# Patient Record
Sex: Female | Born: 1950 | Race: White | Hispanic: No | Smoking: Never smoker
Health system: Southern US, Community
[De-identification: ages and names within clinical notes are randomized; demographics above are authoritative.]

## PROBLEM LIST (undated history)

## (undated) DIAGNOSIS — F419 Anxiety disorder, unspecified: Secondary | ICD-10-CM

## (undated) DIAGNOSIS — T8859XA Other complications of anesthesia, initial encounter: Secondary | ICD-10-CM

## (undated) DIAGNOSIS — D649 Anemia, unspecified: Secondary | ICD-10-CM

## (undated) DIAGNOSIS — K219 Gastro-esophageal reflux disease without esophagitis: Secondary | ICD-10-CM

## (undated) DIAGNOSIS — Z9889 Other specified postprocedural states: Secondary | ICD-10-CM

## (undated) DIAGNOSIS — J45909 Unspecified asthma, uncomplicated: Secondary | ICD-10-CM

## (undated) DIAGNOSIS — M1990 Unspecified osteoarthritis, unspecified site: Secondary | ICD-10-CM

## (undated) DIAGNOSIS — Z87442 Personal history of urinary calculi: Secondary | ICD-10-CM

## (undated) DIAGNOSIS — R011 Cardiac murmur, unspecified: Secondary | ICD-10-CM

## (undated) HISTORY — PX: KIDNEY STONE SURGERY: SHX686

## (undated) HISTORY — PX: OTHER SURGICAL HISTORY: SHX169

## (undated) HISTORY — PX: PARTIAL HYSTERECTOMY: SHX80

## (undated) HISTORY — PX: BREAST CYST EXCISION: SHX579

## (undated) HISTORY — PX: MULTIPLE TOOTH EXTRACTIONS: SHX2053

---

## 1984-12-15 HISTORY — PX: BREAST CYST EXCISION: SHX579

## 1998-06-06 ENCOUNTER — Other Ambulatory Visit: Admission: RE | Admit: 1998-06-06 | Discharge: 1998-06-06 | Payer: Self-pay | Admitting: Gynecology

## 1999-07-19 ENCOUNTER — Ambulatory Visit (HOSPITAL_BASED_OUTPATIENT_CLINIC_OR_DEPARTMENT_OTHER): Admission: RE | Admit: 1999-07-19 | Discharge: 1999-07-19 | Payer: Self-pay | Admitting: *Deleted

## 2000-07-23 ENCOUNTER — Other Ambulatory Visit: Admission: RE | Admit: 2000-07-23 | Discharge: 2000-07-23 | Payer: Self-pay | Admitting: Gynecology

## 2000-08-12 ENCOUNTER — Other Ambulatory Visit: Admission: RE | Admit: 2000-08-12 | Discharge: 2000-08-12 | Payer: Self-pay | Admitting: Gynecology

## 2000-08-12 ENCOUNTER — Encounter (INDEPENDENT_AMBULATORY_CARE_PROVIDER_SITE_OTHER): Payer: Self-pay

## 2001-05-26 ENCOUNTER — Encounter (INDEPENDENT_AMBULATORY_CARE_PROVIDER_SITE_OTHER): Payer: Self-pay | Admitting: Specialist

## 2001-05-26 ENCOUNTER — Inpatient Hospital Stay (HOSPITAL_COMMUNITY): Admission: RE | Admit: 2001-05-26 | Discharge: 2001-05-27 | Payer: Self-pay | Admitting: Gynecology

## 2002-05-19 ENCOUNTER — Other Ambulatory Visit: Admission: RE | Admit: 2002-05-19 | Discharge: 2002-05-19 | Payer: Self-pay | Admitting: Gynecology

## 2002-09-14 ENCOUNTER — Ambulatory Visit (HOSPITAL_COMMUNITY): Admission: RE | Admit: 2002-09-14 | Discharge: 2002-09-14 | Payer: Self-pay | Admitting: Gastroenterology

## 2003-07-17 ENCOUNTER — Other Ambulatory Visit: Admission: RE | Admit: 2003-07-17 | Discharge: 2003-07-17 | Payer: Self-pay | Admitting: Gynecology

## 2004-07-17 ENCOUNTER — Other Ambulatory Visit: Admission: RE | Admit: 2004-07-17 | Discharge: 2004-07-17 | Payer: Self-pay | Admitting: Gynecology

## 2005-07-09 ENCOUNTER — Ambulatory Visit: Payer: Self-pay | Admitting: Oncology

## 2005-07-16 ENCOUNTER — Other Ambulatory Visit: Admission: RE | Admit: 2005-07-16 | Discharge: 2005-07-16 | Payer: Self-pay | Admitting: Gynecology

## 2006-07-22 ENCOUNTER — Other Ambulatory Visit: Admission: RE | Admit: 2006-07-22 | Discharge: 2006-07-22 | Payer: Self-pay | Admitting: Gynecology

## 2007-08-03 ENCOUNTER — Other Ambulatory Visit: Admission: RE | Admit: 2007-08-03 | Discharge: 2007-08-03 | Payer: Self-pay | Admitting: Gynecology

## 2008-08-24 ENCOUNTER — Other Ambulatory Visit: Admission: RE | Admit: 2008-08-24 | Discharge: 2008-08-24 | Payer: Self-pay | Admitting: Gynecology

## 2011-05-02 NOTE — H&P (Signed)
[facility]  Patient:    Shelly Parker, Shelly Parker                      MRN: [id number] Adm. Date:  05/26/01 Attending:  Leatha Gilding. Mezer, M.D.                         History and Physical  ADMITTING DIAGNOSIS:  Fibroid uterus and menorrhagia.  HISTORY OF PRESENT ILLNESS:  The patient is a 60 year old nulligravida female admitted with severe menorrhagia and fibroid uterus for subtotal hysterectomy and possible bilateral salpingo-oophorectomy.  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  The menorrhagia has become progressively worse and endometrial biopsy was obtained in August of 2001 which revealed benign proliferative endometrium and no hyperplasia or malignancy identified.  The bleeding has continued, which has now progressed to flooding, which on two episodes has frightened the patient tremendously because of the volume and duration of the bleeding.  Saline ultrasound was performed which revealed a fibroid uterus with a normal cavity.  The patient was encouraged to either consider a hysterectomy or a uterine fibroid embolization to prevent future flooding. The patient has been extremely anxious regarding this situation.  The patient has chosen to proceed with a subtotal hysterectomy.  The patient has been advised to have both of her ovaries removed secondary to her age and with the possibility of breast cancer in association with ovarian cancer and that oophorectomy may reduce this chance; the patient has declined to have an elective salpingo-oophorectomy but would want her ovaries removed if they appeared to be abnormal at the time of the surgery or it is surgically necessary to remove the ovaries.  The patient also wants to remove as little tissue as possible and has elected to proceed with a subtotal hysterectomy. The patient understands that there may be some bleeding after this  procedure. Patient states that all of her Pap smears have been normal.  The potential complications including anesthesia, injury to the bowel, bladder, ureters and major blood vessels, and blood loss with transfusion and its sequelae and infection have been discussed with the patient and her husband in detail. Postoperative restrictions and expectations have been reviewed in great detail.  PAST MEDICAL HISTORY:  Surgical:  Breast biopsy x 2, teeth.  Medical:  Noncontributory.  MEDICATIONS:  St. Johns wort, tamoxifen, iron, vitamins.  ALLERGIES:  None known.  FAMILY HISTORY:  Noncontributory.  SOCIAL HISTORY:  The patient is married with a supportive husband.  PHYSICAL EXAMINATION:  HEENT:  Normal.  LUNGS:  Clear.  HEART:  Without murmurs.  BREASTS:  Soft without mass or discharge.  ABDOMEN:  Soft and nontender.  PELVIC:  Exam reveals the BUS, vagina and cervix to be normal.  The uterus is top normal in size and retroverted.  The adnexa are without masses.  EXTREMITIES:  Negative.  IMPRESSION: 1. Menorrhagia. 2. Fibroid uterus. 3. Atypical ductal hyperplasia of the breasts.  PLAN:  Subtotal hysterectomy and possible bilateral salpingo-oophorectomy. DD:  05/26/01 TD:  05/26/01 Job: [id number]

## 2011-05-02 NOTE — Op Note (Signed)
NAME:  Shelly Parker, Shelly Parker                       ACCOUNT NO.:  [id number]   MEDICAL RECORD NO.:  [id number]                   PATIENT TYPE:  AMB   LOCATION:  ENDO                                 FACILITY:  [facility]   PHYSICIAN:  Charna Elizabeth, M.D.                   DATE OF BIRTH:  06/16/1951   DATE OF PROCEDURE:  09/14/2002  DATE OF DISCHARGE:                                 OPERATIVE REPORT   PROCEDURE PERFORMED:  Colonoscopy.   ENDOSCOPIST:  Charna Elizabeth, M.D.   INSTRUMENT USED:  Olympus video colonoscope.   INDICATIONS OF PROCEDURE:  A 60 year old white female undergoing screening  colonoscopy.  The patient has a history of rectal bleeding, family history  of colonic polyps in her sister, and a history of atypical hyperplasia of  the breast, rule out colonic polyps, masses hemorrhoids, etc.   PREPROCEDURE PREPARATION:  Informed consent was procured from the patient.  The patient fasted for eight hours prior to the procedure and prepped with a  bottle of magnesium citrate and a gallon of NuLytely the night prior to the  procedure.   PREPROCEDURE PHYSICAL:  The patient had stable vital signs.  Neck supple.  Chest clear to auscultation.  S1 and S2 regular.  Abdomen soft with normal  bowel sounds.   DESCRIPTION OF PROCEDURE:  The patient was placed in the left lateral  decubitus position and sedated with 100 mg of Demerol and 10 mg of Versed  intravenously.  Once the patient was adequately sedated and maintained on  low-flow oxygen and continuous cardiac monitoring, the Olympus video  colonoscope was advanced from the rectum to the cecum without difficulty.  The patient had some abdominal discomfort with insufflation of air into the  colon.  No masses, polyps, erosions, ulcerations or diverticula were seen.  The patient had small, nonbleeding internal hemorrhoids seen on retroflex in  the rectum.  The procedure was completed up to the cecum.  The appendiceal  orifice and ileocecal  valve were clearly visualized and photographed.   IMPRESSION:  1. Healthy-appearing colon up to the cecum.  2. Small, nonbleeding internal hemorrhoids.  3. Abdominal discomfort with insufflation of air into the colon.  Question     of visceral hypersensitivity.   RECOMMENDATIONS:  1. Increase fluid and fiber in the diet.  2.     Repeat colorectal cancer screening in the next five years unless the patient     develops any abnormal symptoms in the interim.  3. Outpatient followup on a p.r.n. basis.                                               Charna Elizabeth, M.D.    JM/MEDQ  D:  09/14/2002  T:  09/15/2002  Job:  [id number]  cc:   Lennis P. Darrold Span, M.D.  [address] [facility]  [place]  [place] [address]  Fax: [phone]   Anna Genre. Little, M.D.  [address]  [place]  [place] [address]  Fax: [phone]

## 2011-05-02 NOTE — Op Note (Signed)
[facility]  Patient:    Shelly Parker, Shelly Parker                    MRN: [id number] Proc. Date: 05/26/01 Attending:  Leatha Gilding. Mezer, M.D. CC:         Harl Bowie, M.D.                           Operative Report  PREOPERATIVE DIAGNOSES:  Menorrhagia, fibroid uterus.  POSTOPERATIVE DIAGNOSES:  Menorrhagia, fibroid uterus.  OPERATION PERFORMED:  Supracervical hysterectomy.  SURGEON:  Leatha Gilding. Mezer, M.D.  ASSISTANT:  Harl Bowie, M.D.  ANESTHESIA:  General endotracheal.  PREPARATION:  Betadine.  DESCRIPTION OF PROCEDURE:  With the patient in the supine position, she was prepped and draped in the routine fashion.  A Pfannenstiel incision was made through the skin and subcutaneous tissue.  The fascia and peritoneum were opened without difficulty.  Brief exploration of her upper abdomen was benign. Exploration of the pelvis revealed the uterus to be approximately 12+ weeks in size with multiple fibroids.  The ovaries were normal bilaterally.  throughout the procedure, the tissue appeared to have much less elasticity and was tougher than usual, making the dissection planes more difficult to find and develop.  The surgical team raised the question that the Tamoxifen may have been responsible for this change in tissue quality.  The round ligaments were suture ligated with #1 chromic and divided.  The uteroovarian ligaments were isolated, clamped, cut, and free tied with #1 chromic and suture ligated with #1 chromic.  The anterior leaf of the broad ligament was then opened.  The bladder was taken down sharply, as it was quite adherent to the body of the cervix.  There appeared to be no harm to the bladder.  The uterine arteries were clamped, cut, and suture ligated with #1 chromic.  One bite of the cardinal ligaments was taken, clamped, cut, and suture ligated with #1 chromic.  It was felt that sufficient tissue had been taken, and the uterus was  removed by separating the uterus from the cervix with cautery.  This was done with total visualization at all times.  Angled sutures of #1 chromic were then placed and then anterior and posterior figure-of-eight stitches were taken to close over the top of the cervix after the canal of the cervix had been cauterized with Bovie cautery.  Several small bleeding spots were arrested with cautery.  The pelvis was irrigated with warm lactated Ringers solution.  The ureters inspected and found to be out of harms way.  At the completion of the procedure, an effort was made to place the large bowel in the cul-de-sac.  The ovaries were elevated.  There was a large amount of stool throughout the colon.  The omentum was brought down.  The abdomen was closed in layers using a running 2-0 Vicryl on the peritoneum, running 0 Vicryl at the midline bilaterally on the fascia.  Hemostasis was assured in the subcutaneous tissue, and the skin was closed with staples.  The estimated blood loss was approximately 100 cc.  All sponge, needle and instrument counts were correct x 2.  The patient tolerated the procedure well and was taken to the recovery room in satisfactory condition. DD:  05/26/01 TD:  05/26/01 Job: [id number]

## 2016-06-12 ENCOUNTER — Other Ambulatory Visit: Payer: Self-pay | Admitting: Obstetrics & Gynecology

## 2016-06-12 DIAGNOSIS — R928 Other abnormal and inconclusive findings on diagnostic imaging of breast: Secondary | ICD-10-CM

## 2016-06-30 ENCOUNTER — Ambulatory Visit
Admission: RE | Admit: 2016-06-30 | Discharge: 2016-06-30 | Disposition: A | Discharge status: Home / Self Care | Payer: BLUE CROSS/BLUE SHIELD | Source: Ambulatory Visit | Attending: Obstetrics & Gynecology | Admitting: Obstetrics & Gynecology

## 2016-06-30 DIAGNOSIS — R928 Other abnormal and inconclusive findings on diagnostic imaging of breast: Secondary | ICD-10-CM

## 2016-12-24 ENCOUNTER — Other Ambulatory Visit: Payer: Self-pay | Admitting: Obstetrics & Gynecology

## 2016-12-24 DIAGNOSIS — N63 Unspecified lump in unspecified breast: Secondary | ICD-10-CM

## 2017-02-02 ENCOUNTER — Ambulatory Visit
Admission: RE | Admit: 2017-02-02 | Discharge: 2017-02-02 | Disposition: A | Discharge status: Home / Self Care | Payer: No Typology Code available for payment source | Source: Ambulatory Visit | Attending: Obstetrics & Gynecology | Admitting: Obstetrics & Gynecology

## 2017-02-02 DIAGNOSIS — N63 Unspecified lump in unspecified breast: Secondary | ICD-10-CM

## 2017-07-13 ENCOUNTER — Other Ambulatory Visit: Payer: Self-pay | Admitting: Obstetrics & Gynecology

## 2017-07-13 DIAGNOSIS — Z1231 Encounter for screening mammogram for malignant neoplasm of breast: Secondary | ICD-10-CM

## 2017-07-13 DIAGNOSIS — N63 Unspecified lump in unspecified breast: Secondary | ICD-10-CM

## 2017-08-04 ENCOUNTER — Ambulatory Visit
Admission: RE | Admit: 2017-08-04 | Discharge: 2017-08-04 | Disposition: A | Discharge status: Home / Self Care | Payer: No Typology Code available for payment source | Source: Ambulatory Visit | Attending: Obstetrics & Gynecology | Admitting: Obstetrics & Gynecology

## 2017-08-04 DIAGNOSIS — N63 Unspecified lump in unspecified breast: Secondary | ICD-10-CM

## 2018-06-21 ENCOUNTER — Other Ambulatory Visit: Payer: Self-pay | Admitting: Obstetrics & Gynecology

## 2018-06-21 DIAGNOSIS — N6320 Unspecified lump in the left breast, unspecified quadrant: Secondary | ICD-10-CM

## 2018-08-05 ENCOUNTER — Ambulatory Visit
Admission: RE | Admit: 2018-08-05 | Discharge: 2018-08-05 | Disposition: A | Discharge status: Home / Self Care | Payer: Medicare Other | Source: Ambulatory Visit | Attending: Obstetrics & Gynecology | Admitting: Obstetrics & Gynecology

## 2018-08-05 DIAGNOSIS — N6320 Unspecified lump in the left breast, unspecified quadrant: Secondary | ICD-10-CM

## 2019-09-21 ENCOUNTER — Other Ambulatory Visit: Payer: Self-pay | Admitting: Obstetrics & Gynecology

## 2019-09-21 DIAGNOSIS — R928 Other abnormal and inconclusive findings on diagnostic imaging of breast: Secondary | ICD-10-CM

## 2019-09-28 ENCOUNTER — Ambulatory Visit: Payer: Medicare Other

## 2019-09-28 ENCOUNTER — Ambulatory Visit
Admission: RE | Admit: 2019-09-28 | Discharge: 2019-09-28 | Disposition: A | Discharge status: Home / Self Care | Payer: Medicare Other | Source: Ambulatory Visit | Attending: Obstetrics & Gynecology | Admitting: Obstetrics & Gynecology

## 2019-09-28 ENCOUNTER — Other Ambulatory Visit: Payer: Self-pay

## 2019-09-28 DIAGNOSIS — R928 Other abnormal and inconclusive findings on diagnostic imaging of breast: Secondary | ICD-10-CM

## 2022-02-13 ENCOUNTER — Other Ambulatory Visit: Payer: Self-pay | Admitting: Obstetrics & Gynecology

## 2022-02-13 DIAGNOSIS — R928 Other abnormal and inconclusive findings on diagnostic imaging of breast: Secondary | ICD-10-CM

## 2022-03-10 ENCOUNTER — Ambulatory Visit: Payer: Medicare Other

## 2022-03-10 ENCOUNTER — Ambulatory Visit
Admission: RE | Admit: 2022-03-10 | Discharge: 2022-03-10 | Disposition: A | Discharge status: Home / Self Care | Payer: Medicare Other | Source: Ambulatory Visit | Attending: Obstetrics & Gynecology | Admitting: Obstetrics & Gynecology

## 2022-03-10 ENCOUNTER — Other Ambulatory Visit: Payer: Self-pay

## 2022-03-10 DIAGNOSIS — R928 Other abnormal and inconclusive findings on diagnostic imaging of breast: Secondary | ICD-10-CM

## 2022-12-16 NOTE — Progress Notes (Signed)
Anesthesia Review:  PCP: [name] LOV 11/28/22  Cardiologist : Chest x-ray : EKG : Echo : Stress test: Cardiac Cath :  Activity level:  Sleep Study/ CPAP : Fasting Blood Sugar :      / Checks Blood Sugar -- times a day:   Blood Thinner/ Instructions /Last Dose: ASA / Instructions/ Last Dose :

## 2022-12-17 NOTE — Patient Instructions (Signed)
SURGICAL WAITING ROOM VISITATION  Patients having surgery or a procedure may have no more than 2 support people in the waiting area - these visitors may rotate.    Children under the age of 76 must have an adult with them who is not the patient.  Due to an increase in RSV and influenza rates and associated hospitalizations, children ages 40 and under may not visit patients in [place].  If the patient needs to stay at the hospital during part of their recovery, the visitor guidelines for inpatient rooms apply. Pre-op nurse will coordinate an appropriate time for 1 support person to accompany patient in pre-op.  This support person may not rotate.    Please refer to the [facility] website for the visitor guidelines for Inpatients (after your surgery is over and you are in a regular room).       Your procedure is scheduled on:  12/23/2022    Report to [facility] Main Entrance    Report to admitting at   1200 noon    Call this number if you have problems the morning of surgery [phone]   Do not eat food :After Midnight.   After Midnight you may have the following liquids until _ 1115_____ AM DAY OF SURGERY  Water Non-Citrus Juices (without pulp, NO RED-Apple, White grape, White cranberry) Black Coffee (NO MILK/CREAM OR CREAMERS, sugar ok)  Clear Tea (NO MILK/CREAM OR CREAMERS, sugar ok) regular and decaf                             Plain Jell-O (NO RED)                                           Fruit ices (not with fruit pulp, NO RED)                                     Popsicles (NO RED)                                                               Sports drinks like Gatorade (NO RED)              .        The day of surgery:  Drink ONE (1) Pre-Surgery Clear Ensure or G2 at 1115  AM ( have completed by )  the morning of surgery. Drink in one sitting. Do not sip.  This drink was given to you during your hospital  pre-op appointment visit. Nothing else  to drink after completing the  Pre-Surgery Clear Ensure or G2.          If you have questions, please contact your surgeon's office.       Oral Hygiene is also important to reduce your risk of infection.                                    Remember - BRUSH YOUR TEETH THE  MORNING OF SURGERY WITH YOUR REGULAR TOOTHPASTE  DENTURES WILL BE REMOVED PRIOR TO SURGERY PLEASE DO NOT APPLY "Poly grip" OR ADHESIVES!!!   Do NOT smoke after Midnight   Take these medicines the morning of surgery with A SIP OF WATER:  Inhalers as usual and bring, floniase, toprol   DO NOT TAKE ANY ORAL DIABETIC MEDICATIONS DAY OF YOUR SURGERY  Bring CPAP mask and tubing day of surgery.                              You may not have any metal on your body including hair pins, jewelry, and body piercing             Do not wear make-up, lotions, powders, perfumes/cologne, or deodorant  Do not wear nail polish including gel and S&S, artificial/acrylic nails, or any other type of covering on natural nails including finger and toenails. If you have artificial nails, gel coating, etc. that needs to be removed by a nail salon please have this removed prior to surgery or surgery may need to be canceled/ delayed if the surgeon/ anesthesia feels like they are unable to be safely monitored.   Do not shave  48 hours prior to surgery.               Men may shave face and neck.   Do not bring valuables to the hospital. [place].   Contacts, glasses, dentures or bridgework may not be worn into surgery.   Bring small overnight bag day of surgery.   DO NOT [place]. PHARMACY WILL DISPENSE MEDICATIONS LISTED ON YOUR MEDICATION LIST TO YOU DURING YOUR ADMISSION [place]!    Patients discharged on the day of surgery will not be allowed to drive home.  Someone NEEDS to stay with you for the first 24 hours after anesthesia.   Special  Instructions: Bring a copy of your healthcare power of attorney and living will documents the day of surgery if you haven't scanned them before.              Please read over the following fact sheets you were given: IF [place] [phone]   If you received a COVID test during your pre-op visit  it is requested that you wear a mask when out in public, stay away from anyone that may not be feeling well and notify your surgeon if you develop symptoms. If you test positive for Covid or have been in contact with anyone that has tested positive in the last 10 days please notify you surgeon.    [place] - Preparing for Surgery Before surgery, you can play an important role.  Because skin is not sterile, your skin needs to be as free of germs as possible.  You can reduce the number of germs on your skin by washing with CHG (chlorahexidine gluconate) soap before surgery.  CHG is an antiseptic cleaner which kills germs and bonds with the skin to continue killing germs even after washing. Please DO NOT use if you have an allergy to CHG or antibacterial soaps.  If your skin becomes reddened/irritated stop using the CHG and inform your nurse when you arrive at Short Stay. Do not shave (including legs and underarms) for  at least 48 hours prior to the first CHG shower.  You may shave your face/neck. Please follow these instructions carefully:  1.  Shower with CHG Soap the night before surgery and the  morning of Surgery.  2.  If you choose to wash your hair, wash your hair first as usual with your  normal  shampoo.  3.  After you shampoo, rinse your hair and body thoroughly to remove the  shampoo.                           4.  Use CHG as you would any other liquid soap.  You can apply chg directly  to the skin and wash                       Gently with a scrungie or clean washcloth.  5.  Apply the CHG Soap to your body ONLY FROM THE NECK DOWN.   Do not use  on face/ open                           Wound or open sores. Avoid contact with eyes, ears mouth and genitals (private parts).                       Wash face,  Genitals (private parts) with your normal soap.             6.  Wash thoroughly, paying special attention to the area where your surgery  will be performed.  7.  Thoroughly rinse your body with warm water from the neck down.  8.  DO NOT shower/wash with your normal soap after using and rinsing off  the CHG Soap.                9.  Pat yourself dry with a clean towel.            10.  Wear clean pajamas.            11.  Place clean sheets on your bed the night of your first shower and do not  sleep with pets. Day of Surgery : Do not apply any lotions/deodorants the morning of surgery.  Please wear clean clothes to the hospital/surgery center.  FAILURE TO FOLLOW THESE INSTRUCTIONS MAY RESULT IN THE CANCELLATION OF YOUR SURGERY PATIENT SIGNATURE_________________________________  NURSE SIGNATURE__________________________________  ________________________________________________________________________

## 2022-12-18 ENCOUNTER — Other Ambulatory Visit: Payer: Self-pay

## 2022-12-18 ENCOUNTER — Encounter (HOSPITAL_COMMUNITY)
Admission: RE | Admit: 2022-12-18 | Discharge: 2022-12-18 | Disposition: A | Discharge status: Home / Self Care | Payer: Medicare Other | Source: Ambulatory Visit | Attending: Orthopedic Surgery | Admitting: Orthopedic Surgery

## 2022-12-18 ENCOUNTER — Encounter (HOSPITAL_COMMUNITY): Payer: Self-pay

## 2022-12-18 VITALS — BP 184/87 | HR 62 | Temp 98.6°F | Resp 16 | Ht 64.5 in | Wt 199.0 lb

## 2022-12-18 DIAGNOSIS — E139 Other specified diabetes mellitus without complications: Secondary | ICD-10-CM | POA: Insufficient documentation

## 2022-12-18 DIAGNOSIS — Z01818 Encounter for other preprocedural examination: Secondary | ICD-10-CM | POA: Diagnosis present

## 2022-12-18 HISTORY — DX: Anemia, unspecified: D64.9

## 2022-12-18 HISTORY — DX: Unspecified osteoarthritis, unspecified site: M19.90

## 2022-12-18 HISTORY — DX: Gastro-esophageal reflux disease without esophagitis: K21.9

## 2022-12-18 HISTORY — DX: Unspecified asthma, uncomplicated: J45.909

## 2022-12-18 HISTORY — DX: Other complications of anesthesia, initial encounter: T88.59XA

## 2022-12-18 HISTORY — DX: Personal history of urinary calculi: Z87.442

## 2022-12-18 HISTORY — DX: Other specified postprocedural states: Z98.890

## 2022-12-18 HISTORY — DX: Anxiety disorder, unspecified: F41.9

## 2022-12-18 HISTORY — DX: Cardiac murmur, unspecified: R01.1

## 2022-12-18 LAB — BASIC METABOLIC PANEL
Anion gap: 8 (ref 5–15)
BUN: 22 mg/dL (ref 8–23)
CO2: 26 mmol/L (ref 22–32)
Calcium: 9.2 mg/dL (ref 8.9–10.3)
Chloride: 105 mmol/L (ref 98–111)
Creatinine, Ser: 0.68 mg/dL (ref 0.44–1.00)
GFR, Estimated: >60 mL/min (ref >60)
Glucose, Bld: 113 mg/dL — ABNORMAL HIGH (ref 70–99)
Potassium: 4.2 mmol/L (ref 3.5–5.1)
Sodium: 139 mmol/L (ref 135–145)

## 2022-12-18 LAB — SURGICAL PCR SCREEN
MRSA, PCR: NEGATIVE
Staphylococcus aureus: NEGATIVE

## 2022-12-18 LAB — CBC
HCT: 44.3 % (ref 36.0–46.0)
Hemoglobin: 14.5 g/dL (ref 12.0–15.0)
MCH: 31.6 pg (ref 26.0–34.0)
MCHC: 32.7 g/dL (ref 30.0–36.0)
MCV: 96.5 fL (ref 80.0–100.0)
Platelets: 169 10*3/uL (ref 150–400)
RBC: 4.59 MIL/uL (ref 3.87–5.11)
RDW: 12.9 % (ref 11.5–15.5)
WBC: 6.5 10*3/uL (ref 4.0–10.5)
nRBC: 0 % (ref 0.0–0.2)

## 2022-12-18 LAB — GLUCOSE, CAPILLARY: Glucose-Capillary: 104 mg/dL — ABNORMAL HIGH (ref 70–99)

## 2022-12-19 LAB — HEMOGLOBIN A1C
Hgb A1c MFr Bld: 5.8 % — ABNORMAL HIGH (ref 4.8–5.6)
Mean Plasma Glucose: 120 mg/dL

## 2022-12-22 NOTE — H&P (Signed)
TOTAL KNEE ADMISSION H&P  Patient is being admitted for right total knee arthroplasty.  Subjective:  Chief Complaint:right knee pain.  HPI: Shelly Parker, 72 y.o. female, has a history of pain and functional disability in the right knee due to arthritis and has failed non-surgical conservative treatments for greater than 12 weeks to includeNSAID's and/or analgesics, corticosteriod injections, and activity modification.  Onset of symptoms was gradual, starting 2 years ago with gradually worsening course since that time. The patient noted no past surgery on the right knee(s).  Patient currently rates pain in the right knee(s) at 8 out of 10 with activity. Patient has worsening of pain with activity and weight bearing, pain that interferes with activities of daily living, and pain with passive range of motion.  Patient has evidence of joint space narrowing by imaging studies.There is no active infection.  There are no problems to display for this patient.  Past Medical History:  Diagnosis Date   Anemia    Anxiety    Arthritis    Asthma    Complication of anesthesia    GERD (gastroesophageal reflux disease)    Heart murmur    History of kidney stones    PONV (postoperative nausea and vomiting)     Past Surgical History:  Procedure Laterality Date   BREAST CYST EXCISION Right 1986   benign cyst    BREAST CYST EXCISION Right 2001-2002   benign cyst   KIDNEY STONE SURGERY     llithotripsy      MULTIPLE TOOTH EXTRACTIONS     PARTIAL HYSTERECTOMY     tear duct reconstruction       No current facility-administered medications for this encounter.   Current Outpatient Medications  Medication Sig Dispense Refill Last Dose   acetaminophen (TYLENOL) 500 MG tablet Take 500 mg by mouth every 6 (six) hours as needed for mild pain or moderate pain.      albuterol (VENTOLIN HFA) 108 (90 Base) MCG/ACT inhaler Inhale 1-2 puffs into the lungs every 4 (four) hours as needed for wheezing or  shortness of breath.      calcium elemental as carbonate (TUMS ULTRA 1000) 400 MG chewable tablet Chew 1,000 mg by mouth daily as needed for heartburn (calcium).      clobetasol ointment (TEMOVATE) 0.05 % Apply 1 Application topically daily as needed (irritation).      clotrimazole-betamethasone (LOTRISONE) cream Apply 1 Application topically daily as needed (Itching on foot).      diclofenac Sodium (VOLTAREN) 1 % GEL Apply 2 g topically 2 (two) times daily as needed (pain).      fluticasone (FLONASE) 50 MCG/ACT nasal spray Place 2 sprays into both nostrils daily.      Gelatin 600 MG CAPS Take 600 mg by mouth daily.      hydrochlorothiazide (HYDRODIURIL) 12.5 MG tablet Take 12.5 mg by mouth daily.      hyoscyamine (LEVSIN) 0.125 MG tablet Take 0.125 mg by mouth every 4 (four) hours as needed for cramping (IBS).      Mag Aspart-Potassium Aspart (POTASSIUM & MAGNESIUM ASPARTAT) 250-250 MG CAPS Take 1 tablet by mouth daily.      Meclizine HCl 25 MG CHEW Chew 12.5 mg by mouth daily as needed (Dizziness).      meloxicam (MOBIC) 7.5 MG tablet Take 15 mg by mouth 2 (two) times daily.      Multiple Vitamins-Minerals (MULTIVITAMIN WITH MINERALS) tablet Take 1 tablet by mouth daily. One a day  Omega-3 Fatty Acids (FISH OIL) 500 MG CAPS Take 500 mg by mouth daily.      TOPROL XL 100 MG 24 hr tablet Take 1 tablet by mouth daily.      valACYclovir (VALTREX) 1000 MG tablet Take 1,000 mg by mouth daily as needed (Cold sores).      Vitamin D, Ergocalciferol, (DRISDOL) 1.25 MG (50000 UNIT) CAPS capsule Take 50,000 Units by mouth once a week.      OVER THE COUNTER MEDICATION Gasx- prn      OVER THE COUNTER MEDICATION Immodium prn      OVER THE COUNTER MEDICATION Prn  cough drops      Allergies  Allergen Reactions   Ace Inhibitors Other (See Comments) and Cough    20 years ago   Other Hives    Wine    Social History   Tobacco Use   Smoking status: Never   Smokeless tobacco: Never  Substance Use  Topics   Alcohol use: Never    Family History  Problem Relation Age of Onset   Breast cancer Paternal Aunt      Review of Systems  Constitutional:  Negative for chills and fever.  Respiratory:  Negative for cough and shortness of breath.   Cardiovascular:  Negative for chest pain.  Gastrointestinal:  Negative for nausea and vomiting.  Musculoskeletal:  Positive for arthralgias.     Objective:  Physical Exam Well nourished and well developed. General: Alert and oriented x3, cooperative and pleasant, no acute distress. Head: normocephalic, atraumatic, neck supple. Eyes: EOMI.  Musculoskeletal: Right knee exam: No palpable effusion, warmth or erythema Valgus right knee with a slight flexion contracture Flexion of 110 degrees with crepitation and tightness anteriorly Stable medial and lateral collateral ligaments Left knee is noted to be neutral to slight valgus with some tenderness laterally but not as significant as her right knee Full knee extension and flexion of 120 degrees  Calves soft and nontender. Motor function intact in LE. Strength 5/5 LE bilaterally. Neuro: Distal pulses 2+. Sensation to light touch intact in LE.  Vital signs in last 24 hours:    Labs:   Estimated body mass index is 33.63 kg/m as calculated from the following:   Height as of 12/18/22: 5' 4.5" (1.638 m).   Weight as of 12/18/22: 90.3 kg.   Imaging Review Plain radiographs demonstrate severe degenerative joint disease of the right knee(s). The overall alignment isneutral. The bone quality appears to be adequate for age and reported activity level.      Assessment/Plan:  End stage arthritis, right knee   The patient history, physical examination, clinical judgment of the provider and imaging studies are consistent with end stage degenerative joint disease of the right knee(s) and total knee arthroplasty is deemed medically necessary. The treatment options including medical management,  injection therapy arthroscopy and arthroplasty were discussed at length. The risks and benefits of total knee arthroplasty were presented and reviewed. The risks due to aseptic loosening, infection, stiffness, patella tracking problems, thromboembolic complications and other imponderables were discussed. The patient acknowledged the explanation, agreed to proceed with the plan and consent was signed. Patient is being admitted for inpatient treatment for surgery, pain control, PT, OT, prophylactic antibiotics, VTE prophylaxis, progressive ambulation and ADL's and discharge planning. The patient is planning to be discharged  home.  Therapy Plans: outpatient therapy at [facility] Disposition: Home with sister & brother in law in [place] Planned DVT Prophylaxis: aspirin 81mg  BID DME needed: none PCP:  Dr. Marya Landry, clearance received TXA: IV Allergies: issues with few BP meds - didn't work well Anesthesia Concerns: partial hysterectomy > N/V BMI: 33.8 Last HgbA1c: Not diabetic   Other: - Stay overnight - oxycodone, robaxin, tylenol, meloxicam - nosebleeds with ibuprofen in the past - Worried about pain - **WANTS ICE MACHINE**   Patient's anticipated LOS is less than 2 midnights, meeting these requirements: - Younger than 70 - Lives within 1 hour of care - Has a competent adult at home to recover with post-op recover - NO history of  - Chronic pain requiring opiods  - Diabetes  - Coronary Artery Disease  - Heart failure  - Heart attack  - Stroke  - DVT/VTE  - Cardiac arrhythmia  - Respiratory Failure/COPD  - Renal failure  - Anemia  - Advanced Liver disease  Costella Hatcher, PA-C Orthopedic Surgery [facility] [place] [phone]

## 2022-12-23 ENCOUNTER — Other Ambulatory Visit: Payer: Self-pay

## 2022-12-23 ENCOUNTER — Encounter (HOSPITAL_COMMUNITY)
Admission: RE | Disposition: A | Discharge status: Home / Self Care | Payer: Self-pay | Source: Home / Self Care | Attending: Orthopedic Surgery

## 2022-12-23 ENCOUNTER — Ambulatory Visit (HOSPITAL_BASED_OUTPATIENT_CLINIC_OR_DEPARTMENT_OTHER): Payer: Medicare Other | Admitting: Anesthesiology

## 2022-12-23 ENCOUNTER — Encounter (HOSPITAL_COMMUNITY): Payer: Self-pay | Admitting: Orthopedic Surgery

## 2022-12-23 ENCOUNTER — Observation Stay (HOSPITAL_COMMUNITY)
Admission: RE | Admit: 2022-12-23 | Discharge: 2022-12-24 | Disposition: A | Discharge status: Home / Self Care | Payer: Medicare Other | Attending: Orthopedic Surgery | Admitting: Orthopedic Surgery

## 2022-12-23 ENCOUNTER — Ambulatory Visit (HOSPITAL_COMMUNITY): Payer: Medicare Other | Admitting: Physician Assistant

## 2022-12-23 DIAGNOSIS — J45909 Unspecified asthma, uncomplicated: Secondary | ICD-10-CM | POA: Diagnosis not present

## 2022-12-23 DIAGNOSIS — M1711 Unilateral primary osteoarthritis, right knee: Secondary | ICD-10-CM | POA: Diagnosis not present

## 2022-12-23 DIAGNOSIS — Z79899 Other long term (current) drug therapy: Secondary | ICD-10-CM | POA: Diagnosis not present

## 2022-12-23 DIAGNOSIS — Z7952 Long term (current) use of systemic steroids: Secondary | ICD-10-CM | POA: Insufficient documentation

## 2022-12-23 DIAGNOSIS — Z96651 Presence of right artificial knee joint: Secondary | ICD-10-CM

## 2022-12-23 DIAGNOSIS — Z01818 Encounter for other preprocedural examination: Secondary | ICD-10-CM

## 2022-12-23 HISTORY — PX: TOTAL KNEE ARTHROPLASTY: SHX125

## 2022-12-23 SURGERY — ARTHROPLASTY, KNEE, TOTAL
Anesthesia: Spinal | Site: Knee | Laterality: Right

## 2022-12-23 MED ORDER — ONDANSETRON HCL 4 MG/2ML IJ SOLN
INTRAMUSCULAR | Status: AC
  Filled 2022-12-23: qty 6

## 2022-12-23 MED ORDER — SODIUM CHLORIDE 0.9 % IV SOLN: INTRAVENOUS | Status: DC

## 2022-12-23 MED ORDER — CEFAZOLIN SODIUM-DEXTROSE 2-4 GM/100ML-% IV SOLN
2.0000 g | Freq: Four times a day (QID) | INTRAVENOUS | Status: AC
  Administered 2022-12-23 – 2022-12-24 (×2): 2 g via INTRAVENOUS
  Filled 2022-12-23 (×2): qty 100

## 2022-12-23 MED ORDER — BUPIVACAINE-EPINEPHRINE 0.5% -1:200000 IJ SOLN
INTRAMUSCULAR | Status: DC | PRN
  Administered 2022-12-23: 30 mL

## 2022-12-23 MED ORDER — SODIUM CHLORIDE (PF) 0.9 % IJ SOLN
INTRAMUSCULAR | Status: AC
  Filled 2022-12-23: qty 10

## 2022-12-23 MED ORDER — ONDANSETRON HCL 4 MG/2ML IJ SOLN
INTRAMUSCULAR | Status: DC | PRN
  Administered 2022-12-23: 4 mg via INTRAVENOUS

## 2022-12-23 MED ORDER — PROPOFOL 1000 MG/100ML IV EMUL
INTRAVENOUS | Status: AC
  Filled 2022-12-23: qty 200

## 2022-12-23 MED ORDER — ACETAMINOPHEN 500 MG PO TABS
1000.0000 mg | ORAL_TABLET | Freq: Four times a day (QID) | ORAL | Status: DC
  Administered 2022-12-23 – 2022-12-24 (×4): 1000 mg via ORAL
  Filled 2022-12-23 (×4): qty 2

## 2022-12-23 MED ORDER — ALBUTEROL SULFATE (2.5 MG/3ML) 0.083% IN NEBU: 2.5000 mg | INHALATION_SOLUTION | RESPIRATORY_TRACT | Status: DC | PRN

## 2022-12-23 MED ORDER — DEXAMETHASONE SODIUM PHOSPHATE 10 MG/ML IJ SOLN
INTRAMUSCULAR | Status: DC | PRN
  Administered 2022-12-23: 10 mg

## 2022-12-23 MED ORDER — FENTANYL CITRATE (PF) 100 MCG/2ML IJ SOLN
INTRAMUSCULAR | Status: DC | PRN
  Administered 2022-12-23 (×4): 25 ug via INTRAVENOUS

## 2022-12-23 MED ORDER — DEXAMETHASONE SODIUM PHOSPHATE 10 MG/ML IJ SOLN
10.0000 mg | Freq: Once | INTRAMUSCULAR | Status: AC
  Administered 2022-12-24: 10 mg via INTRAVENOUS
  Filled 2022-12-23: qty 1

## 2022-12-23 MED ORDER — KETOROLAC TROMETHAMINE 30 MG/ML IJ SOLN
INTRAMUSCULAR | Status: DC | PRN
  Administered 2022-12-23: 30 mg

## 2022-12-23 MED ORDER — KETOROLAC TROMETHAMINE 30 MG/ML IJ SOLN
INTRAMUSCULAR | Status: AC
  Filled 2022-12-23: qty 1

## 2022-12-23 MED ORDER — PROPOFOL 500 MG/50ML IV EMUL
INTRAVENOUS | Status: DC | PRN
  Administered 2022-12-23: 100 ug/kg/min via INTRAVENOUS

## 2022-12-23 MED ORDER — PHENYLEPHRINE HCL (PRESSORS) 10 MG/ML IV SOLN
INTRAVENOUS | Status: AC
  Filled 2022-12-23: qty 1

## 2022-12-23 MED ORDER — MIDAZOLAM HCL 2 MG/2ML IJ SOLN
1.0000 mg | Freq: Once | INTRAMUSCULAR | Status: AC
  Administered 2022-12-23: 2 mg via INTRAVENOUS
  Filled 2022-12-23: qty 2

## 2022-12-23 MED ORDER — POVIDONE-IODINE 10 % EX SWAB
2.0000 | Freq: Once | CUTANEOUS | Status: AC
  Administered 2022-12-23: 2 via TOPICAL

## 2022-12-23 MED ORDER — BISACODYL 10 MG RE SUPP: 10.0000 mg | Freq: Every day | RECTAL | Status: DC | PRN

## 2022-12-23 MED ORDER — ONDANSETRON HCL 4 MG PO TABS: 4.0000 mg | ORAL_TABLET | Freq: Four times a day (QID) | ORAL | Status: DC | PRN

## 2022-12-23 MED ORDER — OXYCODONE HCL 5 MG PO TABS: 10.0000 mg | ORAL_TABLET | ORAL | Status: DC | PRN

## 2022-12-23 MED ORDER — ALBUTEROL SULFATE HFA 108 (90 BASE) MCG/ACT IN AERS: 1.0000 | INHALATION_SPRAY | RESPIRATORY_TRACT | Status: DC | PRN

## 2022-12-23 MED ORDER — FENTANYL CITRATE PF 50 MCG/ML IJ SOSY
50.0000 ug | PREFILLED_SYRINGE | Freq: Once | INTRAMUSCULAR | Status: AC
  Administered 2022-12-23: 50 ug via INTRAVENOUS
  Filled 2022-12-23: qty 2

## 2022-12-23 MED ORDER — METOCLOPRAMIDE HCL 5 MG PO TABS: 5.0000 mg | ORAL_TABLET | Freq: Three times a day (TID) | ORAL | Status: DC | PRN

## 2022-12-23 MED ORDER — TRANEXAMIC ACID-NACL 1000-0.7 MG/100ML-% IV SOLN
1000.0000 mg | INTRAVENOUS | Status: AC
  Administered 2022-12-23: 1000 mg via INTRAVENOUS
  Filled 2022-12-23: qty 100

## 2022-12-23 MED ORDER — STERILE WATER FOR IRRIGATION IR SOLN
Status: DC | PRN
  Administered 2022-12-23: 2000 mL

## 2022-12-23 MED ORDER — LACTATED RINGERS IV SOLN: INTRAVENOUS | Status: DC

## 2022-12-23 MED ORDER — ROPIVACAINE HCL 5 MG/ML IJ SOLN
INTRAMUSCULAR | Status: DC | PRN
  Administered 2022-12-23: 20 mL via PERINEURAL

## 2022-12-23 MED ORDER — BUPIVACAINE IN DEXTROSE 0.75-8.25 % IT SOLN
INTRATHECAL | Status: DC | PRN
  Administered 2022-12-23: 1.6 mL via INTRATHECAL

## 2022-12-23 MED ORDER — HYOSCYAMINE SULFATE 0.125 MG SL SUBL: 0.1250 mg | SUBLINGUAL_TABLET | SUBLINGUAL | Status: DC | PRN

## 2022-12-23 MED ORDER — MENTHOL 3 MG MT LOZG: 1.0000 | LOZENGE | OROMUCOSAL | Status: DC | PRN

## 2022-12-23 MED ORDER — ONDANSETRON HCL 4 MG/2ML IJ SOLN: 4.0000 mg | Freq: Four times a day (QID) | INTRAMUSCULAR | Status: DC | PRN

## 2022-12-23 MED ORDER — DOCUSATE SODIUM 100 MG PO CAPS
100.0000 mg | ORAL_CAPSULE | Freq: Two times a day (BID) | ORAL | Status: DC
  Administered 2022-12-23 – 2022-12-24 (×2): 100 mg via ORAL
  Filled 2022-12-23 (×2): qty 1

## 2022-12-23 MED ORDER — ACETAMINOPHEN 500 MG PO TABS
1000.0000 mg | ORAL_TABLET | Freq: Once | ORAL | Status: AC
  Administered 2022-12-23: 1000 mg via ORAL
  Filled 2022-12-23: qty 2

## 2022-12-23 MED ORDER — CEFAZOLIN SODIUM-DEXTROSE 2-4 GM/100ML-% IV SOLN
2.0000 g | INTRAVENOUS | Status: AC
  Administered 2022-12-23: 2 g via INTRAVENOUS
  Filled 2022-12-23: qty 100

## 2022-12-23 MED ORDER — FENTANYL CITRATE (PF) 100 MCG/2ML IJ SOLN
INTRAMUSCULAR | Status: AC
  Filled 2022-12-23: qty 2

## 2022-12-23 MED ORDER — LIDOCAINE 2% (20 MG/ML) 5 ML SYRINGE
INTRAMUSCULAR | Status: DC | PRN
  Administered 2022-12-23: 40 mg via INTRAVENOUS

## 2022-12-23 MED ORDER — DEXAMETHASONE SODIUM PHOSPHATE 10 MG/ML IJ SOLN: 8.0000 mg | Freq: Once | INTRAMUSCULAR | Status: DC

## 2022-12-23 MED ORDER — PROPOFOL 10 MG/ML IV BOLUS
INTRAVENOUS | Status: AC
  Filled 2022-12-23: qty 20

## 2022-12-23 MED ORDER — METHOCARBAMOL 500 MG PO TABS
500.0000 mg | ORAL_TABLET | Freq: Four times a day (QID) | ORAL | Status: DC | PRN
  Administered 2022-12-24 (×2): 500 mg via ORAL
  Filled 2022-12-23 (×2): qty 1

## 2022-12-23 MED ORDER — HYDROCHLOROTHIAZIDE 12.5 MG PO TABS
12.5000 mg | ORAL_TABLET | Freq: Every day | ORAL | Status: DC
  Filled 2022-12-23: qty 1

## 2022-12-23 MED ORDER — MIDAZOLAM HCL 2 MG/2ML IJ SOLN
INTRAMUSCULAR | Status: DC | PRN
  Administered 2022-12-23 (×2): 1 mg via INTRAVENOUS

## 2022-12-23 MED ORDER — MIDAZOLAM HCL 2 MG/2ML IJ SOLN
INTRAMUSCULAR | Status: AC
  Filled 2022-12-23: qty 2

## 2022-12-23 MED ORDER — 0.9 % SODIUM CHLORIDE (POUR BTL) OPTIME
TOPICAL | Status: DC | PRN
  Administered 2022-12-23: 1000 mL

## 2022-12-23 MED ORDER — METHOCARBAMOL 500 MG IVPB - SIMPLE MED
500.0000 mg | Freq: Four times a day (QID) | INTRAVENOUS | Status: DC | PRN
  Administered 2022-12-23: 500 mg via INTRAVENOUS

## 2022-12-23 MED ORDER — LIDOCAINE HCL (PF) 2 % IJ SOLN
INTRAMUSCULAR | Status: AC
  Filled 2022-12-23: qty 20

## 2022-12-23 MED ORDER — HYDROMORPHONE HCL 1 MG/ML IJ SOLN: 0.5000 mg | INTRAMUSCULAR | Status: DC | PRN

## 2022-12-23 MED ORDER — CHLORHEXIDINE GLUCONATE 0.12 % MT SOLN
15.0000 mL | Freq: Once | OROMUCOSAL | Status: AC
  Administered 2022-12-23: 15 mL via OROMUCOSAL

## 2022-12-23 MED ORDER — PHENOL 1.4 % MT LIQD: 1.0000 | OROMUCOSAL | Status: DC | PRN

## 2022-12-23 MED ORDER — PHENYLEPHRINE 80 MCG/ML (10ML) SYRINGE FOR IV PUSH (FOR BLOOD PRESSURE SUPPORT)
PREFILLED_SYRINGE | INTRAVENOUS | Status: AC
  Filled 2022-12-23: qty 10

## 2022-12-23 MED ORDER — ONDANSETRON HCL 4 MG/2ML IJ SOLN: 4.0000 mg | Freq: Once | INTRAMUSCULAR | Status: DC | PRN

## 2022-12-23 MED ORDER — FENTANYL CITRATE PF 50 MCG/ML IJ SOSY: 25.0000 ug | PREFILLED_SYRINGE | INTRAMUSCULAR | Status: DC | PRN

## 2022-12-23 MED ORDER — DIPHENHYDRAMINE HCL 12.5 MG/5ML PO ELIX: 12.5000 mg | ORAL_SOLUTION | ORAL | Status: DC | PRN

## 2022-12-23 MED ORDER — TRANEXAMIC ACID-NACL 1000-0.7 MG/100ML-% IV SOLN
1000.0000 mg | Freq: Once | INTRAVENOUS | Status: AC
  Administered 2022-12-23: 1000 mg via INTRAVENOUS
  Filled 2022-12-23: qty 100

## 2022-12-23 MED ORDER — SODIUM CHLORIDE 0.9 % IR SOLN
Status: DC | PRN
  Administered 2022-12-23: 1000 mL

## 2022-12-23 MED ORDER — PHENYLEPHRINE HCL-NACL 20-0.9 MG/250ML-% IV SOLN
INTRAVENOUS | Status: DC | PRN
  Administered 2022-12-23: 25 ug/min via INTRAVENOUS

## 2022-12-23 MED ORDER — HYOSCYAMINE SULFATE 0.125 MG PO TABS: 0.1250 mg | ORAL_TABLET | ORAL | Status: DC | PRN

## 2022-12-23 MED ORDER — POLYETHYLENE GLYCOL 3350 17 G PO PACK
17.0000 g | PACK | Freq: Two times a day (BID) | ORAL | Status: DC
  Filled 2022-12-23 (×2): qty 1

## 2022-12-23 MED ORDER — MELOXICAM 15 MG PO TABS: 15.0000 mg | ORAL_TABLET | Freq: Two times a day (BID) | ORAL | Status: DC | PRN

## 2022-12-23 MED ORDER — METOPROLOL SUCCINATE ER 50 MG PO TB24
100.0000 mg | ORAL_TABLET | Freq: Every day | ORAL | Status: DC
  Administered 2022-12-23: 100 mg via ORAL
  Filled 2022-12-23: qty 2

## 2022-12-23 MED ORDER — DEXAMETHASONE SODIUM PHOSPHATE 10 MG/ML IJ SOLN
INTRAMUSCULAR | Status: AC
  Filled 2022-12-23: qty 3

## 2022-12-23 MED ORDER — METHOCARBAMOL 500 MG IVPB - SIMPLE MED
INTRAVENOUS | Status: AC
  Filled 2022-12-23: qty 55

## 2022-12-23 MED ORDER — METOCLOPRAMIDE HCL 5 MG/ML IJ SOLN: 5.0000 mg | Freq: Three times a day (TID) | INTRAMUSCULAR | Status: DC | PRN

## 2022-12-23 MED ORDER — PROPOFOL 10 MG/ML IV BOLUS
INTRAVENOUS | Status: DC | PRN
  Administered 2022-12-23: 20 mg via INTRAVENOUS

## 2022-12-23 MED ORDER — OXYCODONE HCL 5 MG PO TABS
5.0000 mg | ORAL_TABLET | ORAL | Status: DC | PRN
  Administered 2022-12-23 – 2022-12-24 (×4): 10 mg via ORAL
  Filled 2022-12-23 (×4): qty 2

## 2022-12-23 MED ORDER — FLUTICASONE PROPIONATE 50 MCG/ACT NA SUSP
2.0000 | Freq: Every day | NASAL | Status: DC
  Filled 2022-12-23: qty 16

## 2022-12-23 MED ORDER — ASPIRIN 81 MG PO CHEW
81.0000 mg | CHEWABLE_TABLET | Freq: Two times a day (BID) | ORAL | Status: DC
  Administered 2022-12-24: 81 mg via ORAL
  Filled 2022-12-23: qty 1

## 2022-12-23 MED ORDER — SODIUM CHLORIDE (PF) 0.9 % IJ SOLN
INTRAMUSCULAR | Status: DC | PRN
  Administered 2022-12-23: 30 mL

## 2022-12-23 MED ORDER — DEXAMETHASONE SODIUM PHOSPHATE 10 MG/ML IJ SOLN
INTRAMUSCULAR | Status: DC | PRN
  Administered 2022-12-23: 8 mg via INTRAVENOUS

## 2022-12-23 MED ORDER — ORAL CARE MOUTH RINSE: 15.0000 mL | Freq: Once | OROMUCOSAL | Status: AC

## 2022-12-23 MED ORDER — SODIUM CHLORIDE (PF) 0.9 % IJ SOLN
INTRAMUSCULAR | Status: AC
  Filled 2022-12-23: qty 30

## 2022-12-23 SURGICAL SUPPLY — 54 items
ADH SKN CLS APL DERMABOND .7 (GAUZE/BANDAGES/DRESSINGS) ×1
ATTUNE MED ANAT PAT 35 KNEE (Knees) IMPLANT
ATTUNE PSFEM RTSZ4 NARCEM KNEE (Femur) IMPLANT
ATTUNE PSRP INSR SZ4 7 KNEE (Insert) IMPLANT
BAG COUNTER SPONGE SURGICOUNT (BAG) IMPLANT
BAG SPEC THK2 15X12 ZIP CLS (MISCELLANEOUS)
BAG SPNG CNTER NS LX DISP (BAG) ×1
BAG ZIPLOCK 12X15 (MISCELLANEOUS) IMPLANT
BASE TIBIAL ROT PLAT SZ 3 KNEE (Knees) IMPLANT
BLADE SAW SGTL 11.0X1.19X90.0M (BLADE) IMPLANT
BLADE SAW SGTL 13.0X1.19X90.0M (BLADE) ×1 IMPLANT
BNDG ELASTIC 6X5.8 VLCR STR LF (GAUZE/BANDAGES/DRESSINGS) ×1 IMPLANT
BOWL SMART MIX CTS (DISPOSABLE) ×1 IMPLANT
BSPLAT TIB 3 CMNT ROT PLAT STR (Knees) ×1 IMPLANT
CEMENT HV SMART SET (Cement) IMPLANT
COVER SURGICAL LIGHT HANDLE (MISCELLANEOUS) ×1 IMPLANT
CUFF TOURN SGL QUICK 34 (TOURNIQUET CUFF)
CUFF TRNQT CYL 34X4.125X (TOURNIQUET CUFF) ×1 IMPLANT
DERMABOND ADVANCED .7 DNX12 (GAUZE/BANDAGES/DRESSINGS) ×1 IMPLANT
DRAPE U-SHAPE 47X51 STRL (DRAPES) ×1 IMPLANT
DRESSING AQUACEL AG SP 3.5X10 (GAUZE/BANDAGES/DRESSINGS) ×1 IMPLANT
DRSG AQUACEL AG SP 3.5X10 (GAUZE/BANDAGES/DRESSINGS) ×1
DURAPREP 26ML APPLICATOR (WOUND CARE) ×2 IMPLANT
ELECT REM PT RETURN 15FT ADLT (MISCELLANEOUS) ×1 IMPLANT
GLOVE BIO SURGEON STRL SZ 6 (GLOVE) ×1 IMPLANT
GLOVE BIOGEL PI IND STRL 6.5 (GLOVE) ×1 IMPLANT
GLOVE BIOGEL PI IND STRL 7.5 (GLOVE) ×1 IMPLANT
GLOVE ORTHO TXT STRL SZ7.5 (GLOVE) ×2 IMPLANT
GOWN STRL REUS W/ TWL LRG LVL3 (GOWN DISPOSABLE) ×2 IMPLANT
GOWN STRL REUS W/TWL LRG LVL3 (GOWN DISPOSABLE) ×2
HANDPIECE INTERPULSE COAX TIP (DISPOSABLE) ×1
HOLDER FOLEY CATH W/STRAP (MISCELLANEOUS) IMPLANT
KIT TURNOVER KIT A (KITS) IMPLANT
MANIFOLD NEPTUNE II (INSTRUMENTS) ×1 IMPLANT
NDL SAFETY ECLIP 18X1.5 (MISCELLANEOUS) IMPLANT
NS IRRIG 1000ML POUR BTL (IV SOLUTION) ×1 IMPLANT
PACK TOTAL KNEE CUSTOM (KITS) ×1 IMPLANT
PIN FIX SIGMA LCS THRD HI (PIN) IMPLANT
PROTECTOR NERVE ULNAR (MISCELLANEOUS) ×1 IMPLANT
SET HNDPC FAN SPRY TIP SCT (DISPOSABLE) ×1 IMPLANT
SET PAD KNEE POSITIONER (MISCELLANEOUS) ×1 IMPLANT
SPIKE FLUID TRANSFER (MISCELLANEOUS) ×2 IMPLANT
SUT MNCRL AB 4-0 PS2 18 (SUTURE) ×1 IMPLANT
SUT STRATAFIX PDS+ 0 24IN (SUTURE) ×1 IMPLANT
SUT VIC AB 1 CT1 36 (SUTURE) ×1 IMPLANT
SUT VIC AB 2-0 CT1 27 (SUTURE) ×2
SUT VIC AB 2-0 CT1 TAPERPNT 27 (SUTURE) ×2 IMPLANT
SYR 3ML LL SCALE MARK (SYRINGE) ×1 IMPLANT
TIBIAL BASE ROT PLAT SZ 3 KNEE (Knees) ×1 IMPLANT
TOWEL GREEN STERILE FF (TOWEL DISPOSABLE) ×1 IMPLANT
TRAY FOLEY MTR SLVR 16FR STAT (SET/KITS/TRAYS/PACK) ×1 IMPLANT
TUBE SUCTION HIGH CAP CLEAR NV (SUCTIONS) ×1 IMPLANT
WATER STERILE IRR 1000ML POUR (IV SOLUTION) ×2 IMPLANT
WRAP KNEE MAXI GEL POST OP (GAUZE/BANDAGES/DRESSINGS) ×1 IMPLANT

## 2022-12-23 NOTE — Anesthesia Postprocedure Evaluation (Signed)
Anesthesia Post Note  Patient: Shelly Parker  Procedure(s) Performed: TOTAL KNEE ARTHROPLASTY (Right: Knee)     Patient location during evaluation: PACU Anesthesia Type: Spinal Level of consciousness: awake, awake and alert and oriented Pain management: pain level controlled Vital Signs Assessment: post-procedure vital signs reviewed and stable Respiratory status: spontaneous breathing, nonlabored ventilation and respiratory function stable Cardiovascular status: blood pressure returned to baseline and stable Postop Assessment: no headache, no backache, spinal receding and no apparent nausea or vomiting Anesthetic complications: no   No notable events documented.  Last Vitals:  Vitals:   12/23/22 1700 12/23/22 1715  BP: (!) 159/64 (!) 168/99  Pulse: (!) 53 (!) 55  Resp: 16 14  Temp:    SpO2: 99% 100%    Last Pain:  Vitals:   12/23/22 1715  TempSrc:   PainSc: 0-No pain    LLE Motor Response: Purposeful movement (12/23/22 1715)   RLE Motor Response: Purposeful movement (12/23/22 1715)   L Sensory Level: S2-Posterior medial thigh and lower leg (12/23/22 1715) R Sensory Level: L4-Anterior knee, lower leg (12/23/22 1715)  Santa Lighter

## 2022-12-23 NOTE — Anesthesia Preprocedure Evaluation (Addendum)
Anesthesia Evaluation  Patient identified by MRN, date of birth, ID band Patient awake    Reviewed: Allergy & Precautions, NPO status , Patient's Chart, lab work & pertinent test results  History of Anesthesia Complications (+) PONV and history of anesthetic complications  Airway Mallampati: III  TM Distance: >3 FB Neck ROM: Full    Dental  (+) Dental Advisory Given, Implants   Pulmonary asthma    Pulmonary exam normal breath sounds clear to auscultation       Cardiovascular negative cardio ROS Normal cardiovascular exam Rhythm:Regular Rate:Normal     Neuro/Psych  PSYCHIATRIC DISORDERS Anxiety     negative neurological ROS     GI/Hepatic Neg liver ROS,GERD  ,,  Endo/Other  negative endocrine ROS    Renal/GU negative Renal ROS     Musculoskeletal  (+) Arthritis  (Right knee osteoarthritis),    Abdominal   Peds  Hematology negative hematology ROS (+) Plt 169k   Anesthesia Other Findings Day of surgery medications reviewed with the patient.  Reproductive/Obstetrics                             Anesthesia Physical Anesthesia Plan  ASA: 2  Anesthesia Plan: Spinal   Post-op Pain Management: Regional block* and Tylenol PO (pre-op)*   Induction: Intravenous  PONV Risk Score and Plan: 3 and Treatment may vary due to age or medical condition, Midazolam, TIVA, Dexamethasone and Ondansetron  Airway Management Planned: Natural Airway and Simple Face Mask  Additional Equipment:   Intra-op Plan:   Post-operative Plan:   Informed Consent: I have reviewed the patients History and Physical, chart, labs and discussed the procedure including the risks, benefits and alternatives for the proposed anesthesia with the patient or authorized representative who has indicated his/her understanding and acceptance.     Dental advisory given  Plan Discussed with: CRNA, Anesthesiologist and  Surgeon  Anesthesia Plan Comments: (Discussed risks and benefits of and differences between spinal and general. Discussed risks of spinal including headache, backache, failure, bleeding, infection, and nerve damage. Patient consents to spinal. Questions answered. Coagulation studies and platelet count acceptable.)       Anesthesia Quick Evaluation

## 2022-12-23 NOTE — Discharge Instructions (Signed)

## 2022-12-23 NOTE — Interval H&P Note (Signed)
History and Physical Interval Note:  12/23/2022 1:28 PM  Shelly Parker  has presented today for surgery, with the diagnosis of Right knee osteoarthritis.  The various methods of treatment have been discussed with the patient and family. After consideration of risks, benefits and other options for treatment, the patient has consented to  Procedure(s): TOTAL KNEE ARTHROPLASTY (Right) as a surgical intervention.  The patient's history has been reviewed, patient examined, no change in status, stable for surgery.  I have reviewed the patient's chart and labs.  Questions were answered to the patient's satisfaction.     Mauri Pole

## 2022-12-23 NOTE — Anesthesia Procedure Notes (Signed)
Anesthesia Regional Block: Adductor canal block   Pre-Anesthetic Checklist: , timeout performed,  Correct Patient, Correct Site, Correct Laterality,  Correct Procedure, Correct Position, site marked,  Risks and benefits discussed,  Surgical consent,  Pre-op evaluation,  At surgeon's request and post-op pain management  Laterality: Right  Prep: chloraprep       Needles:  Injection technique: Single-shot  Needle Type: Echogenic Needle     Needle Length: 9cm  Needle Gauge: 21     Additional Needles:   Procedures:,,,, ultrasound used (permanent image in chart),,    Narrative:  Start time: 12/23/2022 1:50 PM End time: 12/23/2022 1:56 PM Injection made incrementally with aspirations every 5 mL.  Performed by: Personally  Anesthesiologist: Santa Lighter, MD  Additional Notes: No pain on injection. No increased resistance to injection. Injection made in 5cc increments.  Good needle visualization.  Patient tolerated procedure well.

## 2022-12-23 NOTE — Op Note (Signed)
NAME:  Shelly Parker                      MEDICAL RECORD NO.:  [id number]                             FACILITY:  [facility]      PHYSICIAN:  Madlyn Frankel. Charlann Boxer, M.D.  DATE OF BIRTH:  January 09, 1951      DATE OF PROCEDURE:  12/23/2022                                     OPERATIVE REPORT         PREOPERATIVE DIAGNOSIS:  Right knee osteoarthritis.      POSTOPERATIVE DIAGNOSIS:  Right knee osteoarthritis.      FINDINGS:  The patient was noted to have complete loss of cartilage and   bone-on-bone arthritis with associated osteophytes in the lateral and patellofemoral compartments of   the knee with a significant synovitis and associated effusion.  The patient had failed months of conservative treatment including medications, injection therapy, activity modification.     PROCEDURE:  Right total knee replacement.      COMPONENTS USED:  DePuy Attune rotating platform posterior stabilized knee   system, a size 4N femur, 3 tibia, size 7 mm PS AOX insert, and 35 anatomic patellar   button.      SURGEON:  Madlyn Frankel. Charlann Boxer, M.D.      ASSISTANT:  Rosalene Billings, PA-C.      ANESTHESIA:  Regional and Spinal.      SPECIMENS:  None.      COMPLICATION:  None.      DRAINS:  None.  EBL: <100 cc      TOURNIQUET TIME:   Total Tourniquet Time Documented: Thigh (Right) - 24 minutes Total: Thigh (Right) - 24 minutes  .      The patient was stable to the recovery room.      INDICATION FOR PROCEDURE:  Shelly Parker is a 72 y.o. female patient of   mine.  The patient had been seen, evaluated, and treated for months conservatively in the   office with medication, activity modification, and injections.  The patient had   radiographic changes of bone-on-bone arthritis with endplate sclerosis and osteophytes noted.  Based on the radiographic changes and failed conservative measures, the patient   decided to proceed with definitive treatment, total knee replacement.  Risks of infection, DVT, component failure,  need for revision surgery, neurovascular injury were reviewed in the office setting.  The postop course was reviewed stressing the efforts to maximize post-operative satisfaction and function.  Consent was obtained for benefit of pain   relief.      PROCEDURE IN DETAIL:  The patient was brought to the operative theater.   Once adequate anesthesia, preoperative antibiotics, 2 gm of Ancef,1 gm of Tranexamic Acid, and 10 mg of Decadron administered, the patient was positioned supine with a right thigh tourniquet placed.  The  right lower extremity was prepped and draped in sterile fashion.  A time-   out was performed identifying the patient, planned procedure, and the appropriate extremity.      The right lower extremity was placed in the [facility] leg holder.  The leg was   exsanguinated, tourniquet elevated to 225 mmHg.  A midline incision was  made followed by median parapatellar arthrotomy.  Following initial   exposure, attention was first directed to the patella.  Precut   measurement was noted to be 20 mm.  I resected down to 13 mm and used a   35 anatomic patellar button to restore patellar height as well as cover the cut surface.      The lug holes were drilled and a metal shim was placed to protect the   patella from retractors and saw blade during the procedure.      At this point, attention was now directed to the femur.  The femoral   canal was opened with a drill, irrigated to try to prevent fat emboli.  An   intramedullary rod was passed at 3 degrees valgus, 9 mm of bone was   resected off the distal femur.  Following this resection, the tibia was   subluxated anteriorly.  Using the extramedullary guide, 4 mm of bone was resected off   the proximal lateral tibia.  We confirmed the gap would be   stable medially and laterally with a size 5 spacer block as well as confirmed that the tibial cut was perpendicular in the coronal plane, checking with an alignment rod.      Once this  was done, I sized the femur to be a size 4 in the anterior-   posterior dimension, chose a narrow component based on medial and   lateral dimension.  The size 4 rotation block was then pinned in   position anterior referenced using the C-clamp to set rotation.  The   anterior, posterior, and  chamfer cuts were made without difficulty nor   notching making certain that I was along the anterior cortex to help   with flexion gap stability.      The final box cut was made off the lateral aspect of distal femur.      At this point, the tibia was sized to be a size 3.  The size 3 tray was   then pinned in position through the medial third of the tubercle,   drilled, and keel punched.  Trial reduction was now carried with a 4 femur,  3 tibia, a size 7 mm PS insert, and the 35 anatomic patella botton.  The knee was brought to full extension with good flexion stability with the patella   tracking through the trochlea without application of pressure.  Given   all these findings the trial components removed.  Final components were   opened and cement was mixed.  The knee was irrigated with normal saline solution and pulse lavage.  The synovial lining was   then injected with 30 cc of 0.25% Marcaine with epinephrine, 1 cc of Toradol and 30 cc of NS for a total of 61 cc.     Final implants were then cemented onto cleaned and dried cut surfaces of bone with the knee brought to extension with a size 7 mm PS trial insert.      Once the cement had fully cured, excess cement was removed   throughout the knee.  I confirmed that I was satisfied with the range of   motion and stability, and the final size 7 mm PS AOX insert was chosen.  It was   placed into the knee.      The tourniquet had been let down at 24 minutes.  No significant   hemostasis was required.  The extensor mechanism was then reapproximated using #1 Vicryl  and #1 Stratafix sutures with the knee   in flexion.  The   remaining wound was  closed with 2-0 Vicryl and running 4-0 Monocryl.   The knee was cleaned, dried, dressed sterilely using Dermabond and   Aquacel dressing.  The patient was then   brought to recovery room in stable condition, tolerating the procedure   well.   Please note that Physician Assistant, Rosalene Billings, PA-C was present for the entirety of the case, and was utilized for pre-operative positioning, peri-operative retractor management, general facilitation of the procedure and for primary wound closure at the end of the case.              Madlyn Frankel Charlann Boxer, M.D.    12/23/2022 3:50 PM

## 2022-12-23 NOTE — Transfer of Care (Signed)
Immediate Anesthesia Transfer of Care Note  Patient: Shelly Parker  Procedure(s) Performed: TOTAL KNEE ARTHROPLASTY (Right: Knee)  Patient Location: PACU  Anesthesia Type:Spinal  Level of Consciousness: awake  Airway & Oxygen Therapy: Patient Spontanous Breathing and Patient connected to face mask oxygen  Post-op Assessment: Report given to RN and Post -op Vital signs reviewed and stable  Post vital signs: Reviewed and stable  Last Vitals:  Vitals Value Taken Time  BP 132/78 12/23/22 1616  Temp    Pulse 78 12/23/22 1618  Resp 13 12/23/22 1618  SpO2 100 % 12/23/22 1618  Vitals shown include unvalidated device data.  Last Pain:  Vitals:   12/23/22 1400  TempSrc:   PainSc: 0-No pain         Complications: No notable events documented.

## 2022-12-23 NOTE — Anesthesia Procedure Notes (Signed)
Date/Time: 12/23/2022 2:39 PM  Performed by: Cynda Familia, CRNAPre-anesthesia Checklist: Patient identified, Emergency Drugs available, Suction available, Patient being monitored and Timeout performed Patient Re-evaluated:Patient Re-evaluated prior to induction Oxygen Delivery Method: Simple face mask Placement Confirmation: positive ETCO2 and breath sounds checked- equal and bilateral Dental Injury: Teeth and Oropharynx as per pre-operative assessment

## 2022-12-23 NOTE — Anesthesia Procedure Notes (Signed)
Spinal  Patient location during procedure: OR Start time: 12/23/2022 2:44 PM End time: 12/23/2022 2:47 PM Reason for block: surgical anesthesia Staffing Performed: anesthesiologist  Anesthesiologist: Santa Lighter, MD Performed by: Santa Lighter, MD Authorized by: Santa Lighter, MD   Preanesthetic Checklist Completed: patient identified, IV checked, risks and benefits discussed, surgical consent, monitors and equipment checked, pre-op evaluation and timeout performed Spinal Block Patient position: sitting Prep: DuraPrep and site prepped and draped Patient monitoring: continuous pulse ox and blood pressure Approach: midline Location: L3-4 Injection technique: single-shot Needle Needle type: Pencan  Needle gauge: 24 G Assessment Events: CSF return Additional Notes Functioning IV was confirmed and monitors were applied. Sterile prep and drape, including hand hygiene, mask and sterile gloves were used. The patient was positioned and the spine was prepped. The skin was anesthetized with lidocaine.  Free flow of clear CSF was obtained prior to injecting local anesthetic into the CSF.  The spinal needle aspirated freely following injection.  The needle was carefully withdrawn.  The patient tolerated the procedure well. Consent was obtained prior to procedure with all questions answered and concerns addressed. Risks including but not limited to bleeding, infection, nerve damage, paralysis, failed block, inadequate analgesia, allergic reaction, high spinal, itching and headache were discussed and the patient wished to proceed.   Hoy Morn, MD

## 2022-12-24 ENCOUNTER — Encounter (HOSPITAL_COMMUNITY): Payer: Self-pay | Admitting: Orthopedic Surgery

## 2022-12-24 DIAGNOSIS — M1711 Unilateral primary osteoarthritis, right knee: Secondary | ICD-10-CM | POA: Diagnosis not present

## 2022-12-24 LAB — CBC
HCT: 38.9 % (ref 36.0–46.0)
Hemoglobin: 12.6 g/dL (ref 12.0–15.0)
MCH: 32 pg (ref 26.0–34.0)
MCHC: 32.4 g/dL (ref 30.0–36.0)
MCV: 98.7 fL (ref 80.0–100.0)
Platelets: 149 10*3/uL — ABNORMAL LOW (ref 150–400)
RBC: 3.94 MIL/uL (ref 3.87–5.11)
RDW: 12.8 % (ref 11.5–15.5)
WBC: 9.9 10*3/uL (ref 4.0–10.5)
nRBC: 0 % (ref 0.0–0.2)

## 2022-12-24 LAB — BASIC METABOLIC PANEL
Anion gap: 8 (ref 5–15)
BUN: 16 mg/dL (ref 8–23)
CO2: 20 mmol/L — ABNORMAL LOW (ref 22–32)
Calcium: 8.3 mg/dL — ABNORMAL LOW (ref 8.9–10.3)
Chloride: 107 mmol/L (ref 98–111)
Creatinine, Ser: 0.73 mg/dL (ref 0.44–1.00)
GFR, Estimated: >60 mL/min (ref >60)
Glucose, Bld: 196 mg/dL — ABNORMAL HIGH (ref 70–99)
Potassium: 3.9 mmol/L (ref 3.5–5.1)
Sodium: 135 mmol/L (ref 135–145)

## 2022-12-24 MED ORDER — METHOCARBAMOL 500 MG PO TABS: 500.0000 mg | ORAL_TABLET | Freq: Four times a day (QID) | ORAL | 2 refills | Status: AC | PRN

## 2022-12-24 MED ORDER — ASPIRIN 81 MG PO CHEW: 81.0000 mg | CHEWABLE_TABLET | Freq: Two times a day (BID) | ORAL | 0 refills | Status: AC

## 2022-12-24 MED ORDER — SENNA 8.6 MG PO TABS: 1.0000 | ORAL_TABLET | Freq: Every day | ORAL | 0 refills | Status: AC

## 2022-12-24 MED ORDER — POLYETHYLENE GLYCOL 3350 17 G PO PACK: 17.0000 g | PACK | Freq: Two times a day (BID) | ORAL | 0 refills | Status: AC

## 2022-12-24 MED ORDER — OXYCODONE HCL 5 MG PO TABS: 5.0000 mg | ORAL_TABLET | ORAL | 0 refills | Status: AC | PRN

## 2022-12-24 MED ORDER — ACETAMINOPHEN 500 MG PO TABS: 1000.0000 mg | ORAL_TABLET | Freq: Four times a day (QID) | ORAL | 0 refills | Status: AC

## 2022-12-24 NOTE — Evaluation (Signed)
Physical Therapy Evaluation Patient Details Name: Shelly Parker MRN: [id number] DOB: [birth year] Today's Date: 12/24/2022  History of Present Illness  72 yo female s/p R TKA on 12/23/22. PMH: anxiety  Clinical Impression  Pt is s/p TKA resulting in the deficits listed below (see PT Problem List).  Reviewed gait/safety, TKA HEP. Pt is doing well, painm controlled. Will see again in pm and pt should be ready to d/c later today  Pt will benefit from skilled PT to increase their independence and safety with mobility to allow discharge to the venue listed below.         Recommendations for follow up therapy are one component of a multi-disciplinary discharge planning process, led by the attending physician.  Recommendations may be updated based on patient status, additional functional criteria and insurance authorization.  Follow Up Recommendations Follow physician's recommendations for discharge plan and follow up therapies      Assistance Recommended at Discharge Intermittent Supervision/Assistance  Patient can return home with the following  A little help with walking and/or transfers;A little help with bathing/dressing/bathroom;Help with stairs or ramp for entrance;Assistance with cooking/housework;Assist for transportation    Equipment Recommendations None recommended by PT  Recommendations for Other Services       Functional Status Assessment Patient has had a recent decline in their functional status and demonstrates the ability to make significant improvements in function in a reasonable and predictable amount of time.     Precautions / Restrictions Precautions Precautions: Fall;Knee Restrictions Weight Bearing Restrictions: No Other Position/Activity Restrictions: WBAT      Mobility  Bed Mobility               General bed mobility comments: in recliner    Transfers Overall transfer level: Needs assistance Equipment used: Rolling walker (2 wheels) Transfers: Sit  to/from Stand Sit to Stand: Min guard           General transfer comment: cues for hand placement    Ambulation/Gait Ambulation/Gait assistance: Min guard Gait Distance (Feet): 100 Feet (10' more) Assistive device: Rolling walker (2 wheels) Gait Pattern/deviations: Step-to pattern, Decreased stance time - right       General Gait Details: cues for  sequence and RW position  Stairs            Wheelchair Mobility    Modified Rankin (Stroke Patients Only)       Balance                                             Pertinent Vitals/Pain Pain Assessment Pain Assessment: 0-10 Pain Location: right knee Pain Descriptors / Indicators: Sore, Aching Pain Intervention(s): Limited activity within patient's tolerance, Monitored during session, Premedicated before session, Repositioned    Home Living Family/patient expects to be discharged to:: Private residence Living Arrangements: Alone Available Help at Discharge: Family Type of Home: House Home Access: Stairs to enter Entrance Stairs-Rails: Right Entrance Stairs-Number of Steps: 3   Home Layout: One level Home Equipment: Conservation officer, nature (2 wheels)      Prior Function Prior Level of Function : Independent/Modified Independent                     Hand Dominance        Extremity/Trunk Assessment   Upper Extremity Assessment Upper Extremity Assessment: Overall WFL for tasks assessed    Lower Extremity  Assessment Lower Extremity Assessment: RLE deficits/detail RLE Deficits / Details: ankle WF, knee and hip 2+/5, limited by post op pain and weakness       Communication   Communication: No difficulties  Cognition Arousal/Alertness: Awake/alert Behavior During Therapy: WFL for tasks assessed/performed Overall Cognitive Status: Within Functional Limits for tasks assessed                                          General Comments      Exercises Total Joint  Exercises Ankle Circles/Pumps: AROM, Both, 10 reps Quad Sets: AROM, Both, 5 reps Heel Slides: AAROM, 5 reps, Right Straight Leg Raises: AAROM, AROM, Right, 5 reps   Assessment/Plan    PT Assessment Patient needs continued PT services  PT Problem List Decreased strength;Decreased activity tolerance;Decreased mobility;Decreased knowledge of use of DME;Decreased safety awareness;Decreased knowledge of precautions;Decreased range of motion       PT Treatment Interventions DME instruction;Therapeutic exercise;Gait training;Functional mobility training;Therapeutic activities;Patient/family education;Stair training    PT Goals (Current goals can be found in the Care Plan section)  Acute Rehab PT Goals Patient Stated Goal: home PT Goal Formulation: With patient Time For Goal Achievement: 12/31/22 Potential to Achieve Goals: Good    Frequency 7X/week     Co-evaluation               AM-PAC PT "6 Clicks" Mobility  Outcome Measure Help needed turning from your back to your side while in a flat bed without using bedrails?: A Little Help needed moving from lying on your back to sitting on the side of a flat bed without using bedrails?: A Little Help needed moving to and from a bed to a chair (including a wheelchair)?: A Little Help needed standing up from a chair using your arms (e.g., wheelchair or bedside chair)?: A Little Help needed to walk in hospital room?: A Little Help needed climbing 3-5 steps with a railing? : A Little 6 Click Score: 18    End of Session Equipment Utilized During Treatment: Gait belt Activity Tolerance: Patient tolerated treatment well Patient left: in chair;with call bell/phone within reach;with family/visitor present (in chair on arrival, no alarm box in room)   PT Visit Diagnosis: Other abnormalities of gait and mobility (R26.89);Difficulty in walking, not elsewhere classified (R26.2)    Time: 6606-3016 PT Time Calculation (min) (ACUTE ONLY): 33  min   Charges:   PT Evaluation $PT Eval Low Complexity: 1 Low PT Treatments $Gait Training: 8-22 mins        Baxter Flattery, PT  Acute Rehab Dept [facility]) [phone]  [facility] Weekend Pager [facility] only)  [phone]  12/24/2022   [facility] 12/24/2022, 11:29 AM

## 2022-12-24 NOTE — Progress Notes (Signed)
Nurse reviewed discharge instructions with pt.  Pt verbalized understanding of discharge instructions, follow up appointments and new medications.  All questions answered prior to discharge.  Pt's sister at bedside.

## 2022-12-24 NOTE — TOC Transition Note (Signed)
Transition of Care [facility]) - CM/SW Discharge Note  Patient Details  Name: Shelly Parker MRN: [id number] Date of Birth: 1951-02-17  Transition of Care [facility]) CM/SW Contact:  Sherie Don, LCSW Phone Number: 12/24/2022, 10:30 AM  Clinical Narrative: Patient is expected to discharge home after working with PT. CSW met with patient and sister to confirm discharge plan and needs. Patient will go home with OPPT at [facility] PT. Patient has a rolling walker and cane at home, so there are no DME needs at this time. TOC signing off.    Final next level of care: OP Rehab Barriers to Discharge: No Barriers Identified  Patient Goals and CMS Choice Choice offered to / list presented to : NA  Discharge Plan and Services Additional resources added to the After Visit Summary for        DME Arranged: N/A DME Agency: NA  Social Determinants of Health (SDOH) Interventions SDOH Screenings   Food Insecurity: No Food Insecurity (12/23/2022)  Housing: Low Risk  (12/23/2022)  Transportation Needs: No Transportation Needs (12/23/2022)  Utilities: Not At Risk (12/23/2022)  Tobacco Use: Low Risk  (12/23/2022)   Readmission Risk Interventions     No data to display

## 2022-12-24 NOTE — Progress Notes (Signed)
Physical Therapy Treatment Patient Details Name: Shelly Parker MRN: [id number] DOB: [birth year] Today's Date: 12/24/2022   History of Present Illness 72 yo female s/p R TKA on 12/23/22. PMH: anxiety    PT Comments    Pt is progressing, meeting goals; ready to d/c with family  assist as needed from PT standpoint.   Recommendations for follow up therapy are one component of a multi-disciplinary discharge planning process, led by the attending physician.  Recommendations may be updated based on patient status, additional functional criteria and insurance authorization.  Follow Up Recommendations  Follow physician's recommendations for discharge plan and follow up therapies     Assistance Recommended at Discharge Intermittent Supervision/Assistance  Patient can return home with the following A little help with walking and/or transfers;A little help with bathing/dressing/bathroom;Help with stairs or ramp for entrance;Assistance with cooking/housework;Assist for transportation   Equipment Recommendations  None recommended by PT    Recommendations for Other Services       Precautions / Restrictions Precautions Precautions: Fall;Knee Precaution Booklet Issued: No Restrictions Weight Bearing Restrictions: No Other Position/Activity Restrictions: WBAT     Mobility  Bed Mobility               General bed mobility comments: in recliner    Transfers Overall transfer level: Needs assistance Equipment used: Rolling walker (2 wheels) Transfers: Sit to/from Stand Sit to Stand: Min guard           General transfer comment: cues for hand placement, supervision for safety    Ambulation/Gait Ambulation/Gait assistance: Min guard Gait Distance (Feet): 120 Feet Assistive device: Rolling walker (2 wheels) Gait Pattern/deviations: Step-to pattern, Decreased stance time - right       General Gait Details: cues for initial sequence, beginning step through   Stairs Stairs:  Yes Stairs assistance: Min guard, Supervision Stair Management: Step to pattern, Forwards, One rail Right, One rail Left Number of Stairs: 5 (x2) General stair comments: cues for sequence and technique, good stability, no knee buckling   Wheelchair Mobility    Modified Rankin (Stroke Patients Only)       Balance                                            Cognition Arousal/Alertness: Awake/alert Behavior During Therapy: WFL for tasks assessed/performed Overall Cognitive Status: Within Functional Limits for tasks assessed                                          Exercises Total Joint Exercises Ankle Circles/Pumps: AROM, Both, 10 reps Quad Sets: AROM, Both, 5 reps Heel Slides: AAROM, 5 reps, Right Straight Leg Raises: AAROM, AROM, Right, 5 reps    General Comments        Pertinent Vitals/Pain Pain Assessment Pain Assessment: 0-10 Pain Score: 3  Pain Location: right knee Pain Descriptors / Indicators: Sore, Aching Pain Intervention(s): Limited activity within patient's tolerance, Monitored during session, Repositioned    Home Living                          Prior Function            PT Goals (current goals can now be found in the care plan section) Acute Rehab PT  Goals Patient Stated Goal: home PT Goal Formulation: With patient Time For Goal Achievement: 12/31/22 Potential to Achieve Goals: Good Progress towards PT goals: Progressing toward goals    Frequency    7X/week      PT Plan Current plan remains appropriate    Co-evaluation              AM-PAC PT "6 Clicks" Mobility   Outcome Measure  Help needed turning from your back to your side while in a flat bed without using bedrails?: A Little Help needed moving from lying on your back to sitting on the side of a flat bed without using bedrails?: A Little Help needed moving to and from a bed to a chair (including a wheelchair)?: A Little Help  needed standing up from a chair using your arms (e.g., wheelchair or bedside chair)?: None Help needed to walk in hospital room?: A Little Help needed climbing 3-5 steps with a railing? : A Little 6 Click Score: 19    End of Session Equipment Utilized During Treatment: Gait belt Activity Tolerance: Patient tolerated treatment well Patient left: in chair;with call bell/phone within reach;with family/visitor present Nurse Communication: Mobility status PT Visit Diagnosis: Other abnormalities of gait and mobility (R26.89);Difficulty in walking, not elsewhere classified (R26.2)     Time: 3419-6222 PT Time Calculation (min) (ACUTE ONLY): 21 min  Charges:  $Gait Training: 8-22 mins                     Baxter Flattery, PT  Acute Rehab Dept [facility]) [phone]  [facility] Weekend Pager [facility] only)  [phone]  12/24/2022    [facility] 12/24/2022, 3:23 PM

## 2022-12-24 NOTE — Progress Notes (Addendum)
   Subjective: 1 Day Post-Op Procedure(s) (LRB): TOTAL KNEE ARTHROPLASTY (Right) Patient reports pain as mild.   Patient seen in rounds by Dr. Alvan Dame. Patient is well, and has had no acute complaints or problems. No acute events overnight. Foley catheter removed. Patient has not been up with PT yet.  We will start therapy today.   Objective: Vital signs in last 24 hours: Temp:  [97.5 F (36.4 C)-98.3 F (36.8 C)] 97.5 F (36.4 C) (01/10 0524) Pulse Rate:  [51-82] 56 (01/10 0524) Resp:  [10-18] 18 (01/10 0524) BP: (113-172)/(62-99) 152/68 (01/10 0524) SpO2:  [97 %-100 %] 100 % (01/10 0524) Weight:  [90.3 kg] 90.3 kg (01/09 1325)  Intake/Output from previous day:  Intake/Output Summary (Last 24 hours) at 12/24/2022 0737 Last data filed at 12/24/2022 0600 Gross per 24 hour  Intake 3787.5 ml  Output 2250 ml  Net 1537.5 ml     Intake/Output this shift: No intake/output data recorded.  Labs: Recent Labs    12/24/22 0448  HGB 12.6   Recent Labs    12/24/22 0448  WBC 9.9  RBC 3.94  HCT 38.9  PLT 149*   Recent Labs    12/24/22 0448  NA 135  K 3.9  CL 107  CO2 20*  BUN 16  CREATININE 0.73  GLUCOSE 196*  CALCIUM 8.3*   No results for input(s): "LABPT", "INR" in the last 72 hours.  Exam: General - Patient is Alert and Oriented Extremity - Neurologically intact Sensation intact distally Intact pulses distally Dorsiflexion/Plantar flexion intact Dressing - dressing C/D/I Motor Function - intact, moving foot and toes well on exam.   Past Medical History:  Diagnosis Date   Anemia    Anxiety    Arthritis    Asthma    Complication of anesthesia    GERD (gastroesophageal reflux disease)    Heart murmur    History of kidney stones    PONV (postoperative nausea and vomiting)     Assessment/Plan: 1 Day Post-Op Procedure(s) (LRB): TOTAL KNEE ARTHROPLASTY (Right) Principal Problem:   S/P total knee arthroplasty, right  Estimated body mass index is 33.63  kg/m as calculated from the following:   Height as of 12/18/22: 5' 4.5" (1.638 m).   Weight as of this encounter: 90.3 kg. Advance diet Up with therapy D/C IV fluids   Patient's anticipated LOS is less than 2 midnights, meeting these requirements: - Younger than 67 - Lives within 1 hour of care - Has a competent adult at home to recover with post-op recover - NO history of  - Chronic pain requiring opiods  - Diabetes  - Coronary Artery Disease  - Heart failure  - Heart attack  - Stroke  - DVT/VTE  - Cardiac arrhythmia  - Respiratory Failure/COPD  - Renal failure  - Anemia  - Advanced Liver disease     DVT Prophylaxis - Aspirin Weight bearing as tolerated.  Hgb stable at 12.6 this AM.  OPPT set up at [facility] on Friday  Plan is to go Home after hospital stay. Plan for discharge today following 1-2 sessions of PT as long as they are meeting their goals.  Follow up in the office in 2 weeks.   Griffith Citron, PA-C Orthopedic Surgery [phone] 12/24/2022, 7:37 AM

## 2023-01-01 NOTE — Discharge Summary (Signed)
Patient ID: Shelly Parker MRN: [id number] DOB/AGE: Dec 22, 1950 72 y.o.  Admit date: 12/23/2022 Discharge date: 12/24/2022  Admission Diagnoses:  Right knee osteoarthritis  Discharge Diagnoses:  Principal Problem:   S/P total knee arthroplasty, right   Past Medical History:  Diagnosis Date   Anemia    Anxiety    Arthritis    Asthma    Complication of anesthesia    GERD (gastroesophageal reflux disease)    Heart murmur    History of kidney stones    PONV (postoperative nausea and vomiting)     Surgeries: Procedure(s): TOTAL KNEE ARTHROPLASTY on 12/23/2022   Consultants:   Discharged Condition: Improved  Hospital Course: Shelly Parker is an 72 y.o. female who was admitted 12/23/2022 for operative treatment ofS/P total knee arthroplasty, right. Patient has severe unremitting pain that affects sleep, daily activities, and work/hobbies. After pre-op clearance the patient was taken to the operating room on 12/23/2022 and underwent  Procedure(s): TOTAL KNEE ARTHROPLASTY.    Patient was given perioperative antibiotics:  Anti-infectives (From admission, onward)    Start     Dose/Rate Route Frequency Ordered Stop   12/23/22 2100  ceFAZolin (ANCEF) IVPB 2g/100 mL premix        2 g 200 mL/hr over 30 Minutes Intravenous Every 6 hours 12/23/22 1812 12/24/22 1603   12/23/22 1245  ceFAZolin (ANCEF) IVPB 2g/100 mL premix        2 g 200 mL/hr over 30 Minutes Intravenous On call to O.R. 12/23/22 1220 12/23/22 1447        Patient was given sequential compression devices, early ambulation, and chemoprophylaxis to prevent DVT. Patient worked with PT and was meeting their goals regarding safe ambulation and transfers.  Patient benefited maximally from hospital stay and there were no complications.    Recent vital signs: No data found.   Recent laboratory studies: No results for input(s): "WBC", "HGB", "HCT", "PLT", "NA", "K", "CL", "CO2", "BUN", "CREATININE", "GLUCOSE", "INR", "CALCIUM" in  the last 72 hours.  Invalid input(s): "PT", "2"   Discharge Medications:   Allergies as of 12/24/2022       Reactions   Ace Inhibitors Other (See Comments), Cough   20 years ago   Ibuprofen    Pt states " it causes nosebleeds "    Other Hives   Wine        Medication List     TAKE these medications    acetaminophen 500 MG tablet Commonly known as: TYLENOL Take 2 tablets (1,000 mg total) by mouth every 6 (six) hours. What changed:  how much to take when to take this reasons to take this   albuterol 108 (90 Base) MCG/ACT inhaler Commonly known as: VENTOLIN HFA Inhale 1-2 puffs into the lungs every 4 (four) hours as needed for wheezing or shortness of breath.   aspirin 81 MG chewable tablet Chew 1 tablet (81 mg total) by mouth 2 (two) times daily for 28 days.   clobetasol ointment 0.05 % Commonly known as: TEMOVATE Apply 1 Application topically daily as needed (irritation).   clotrimazole-betamethasone cream Commonly known as: LOTRISONE Apply 1 Application topically daily as needed (Itching on foot).   Fish Oil 500 MG Caps Take 500 mg by mouth daily.   fluticasone 50 MCG/ACT nasal spray Commonly known as: FLONASE Place 2 sprays into both nostrils daily.   Gelatin 600 MG Caps Take 600 mg by mouth daily.   hydrochlorothiazide 12.5 MG tablet Commonly known as: HYDRODIURIL Take 12.5 mg by  mouth daily.   hyoscyamine 0.125 MG tablet Commonly known as: LEVSIN Take 0.125 mg by mouth every 4 (four) hours as needed for cramping (IBS).   Meclizine HCl 25 MG Chew Chew 12.5 mg by mouth daily as needed (Dizziness).   meloxicam 7.5 MG tablet Commonly known as: MOBIC Take 15 mg by mouth 2 (two) times daily.   methocarbamol 500 MG tablet Commonly known as: ROBAXIN Take 1 tablet (500 mg total) by mouth every 6 (six) hours as needed for muscle spasms.   multivitamin with minerals tablet Take 1 tablet by mouth daily. One a day   OVER THE COUNTER  MEDICATION Gasx- prn   OVER THE COUNTER MEDICATION Immodium prn   OVER THE COUNTER MEDICATION Prn  cough drops   oxyCODONE 5 MG immediate release tablet Commonly known as: Oxy IR/ROXICODONE Take 1 tablet (5 mg total) by mouth every 4 (four) hours as needed for severe pain.   polyethylene glycol 17 g packet Commonly known as: MIRALAX / GLYCOLAX Take 17 g by mouth 2 (two) times daily.   Potassium & Magnesium Aspartat 250-250 MG Caps Take 1 tablet by mouth daily.   senna 8.6 MG Tabs tablet Commonly known as: SENOKOT Take 1 tablet (8.6 mg total) by mouth at bedtime for 14 days.   Toprol XL 100 MG 24 hr tablet Generic drug: metoprolol succinate Take 1 tablet by mouth daily.   Tums Ultra 1000 400 MG chewable tablet Generic drug: calcium elemental as carbonate Chew 1,000 mg by mouth daily as needed for heartburn (calcium).   valACYclovir 1000 MG tablet Commonly known as: VALTREX Take 1,000 mg by mouth daily as needed (Cold sores).   Vitamin D (Ergocalciferol) 1.25 MG (50000 UNIT) Caps capsule Commonly known as: DRISDOL Take 50,000 Units by mouth once a week.   Voltaren 1 % Gel Generic drug: diclofenac Sodium Apply 2 g topically 2 (two) times daily as needed (pain).               Discharge Care Instructions  (From admission, onward)           Start     Ordered   12/24/22 0000  Change dressing       Comments: Maintain surgical dressing until follow up in the clinic. If the edges start to pull up, may reinforce with tape. If the dressing is no longer working, may remove and cover with gauze and tape, but must keep the area dry and clean.  Call with any questions or concerns.   12/24/22 0741            Diagnostic Studies: No results found.  Disposition: Discharge disposition: 01-Home or Self Care       Discharge Instructions     Call MD / Call 911   Complete by: As directed    If you experience chest pain or shortness of breath, CALL 911 and be  transported to the hospital emergency room.  If you develope a fever above 101 F, pus (white drainage) or increased drainage or redness at the wound, or calf pain, call your surgeon's office.   Change dressing   Complete by: As directed    Maintain surgical dressing until follow up in the clinic. If the edges start to pull up, may reinforce with tape. If the dressing is no longer working, may remove and cover with gauze and tape, but must keep the area dry and clean.  Call with any questions or concerns.   Constipation Prevention  Complete by: As directed    Drink plenty of fluids.  Prune juice may be helpful.  You may use a stool softener, such as Colace (over the counter) 100 mg twice a day.  Use MiraLax (over the counter) for constipation as needed.   Diet - low sodium heart healthy   Complete by: As directed    Increase activity slowly as tolerated   Complete by: As directed    Weight bearing as tolerated with assist device (walker, cane, etc) as directed, use it as long as suggested by your surgeon or therapist, typically at least 4-6 weeks.   Post-operative opioid taper instructions:   Complete by: As directed    POST-OPERATIVE OPIOID TAPER INSTRUCTIONS: It is important to wean off of your opioid medication as soon as possible. If you do not need pain medication after your surgery it is ok to stop day one. Opioids include: Codeine, Hydrocodone(Norco, Vicodin), Oxycodone(Percocet, oxycontin) and hydromorphone amongst others.  Long term and even short term use of opiods can cause: Increased pain response Dependence Constipation Depression Respiratory depression And more.  Withdrawal symptoms can include Flu like symptoms Nausea, vomiting And more Techniques to manage these symptoms Hydrate well Eat regular healthy meals Stay active Use relaxation techniques(deep breathing, meditating, yoga) Do Not substitute Alcohol to help with tapering If you have been on opioids for less  than two weeks and do not have pain than it is ok to stop all together.  Plan to wean off of opioids This plan should start within one week post op of your joint replacement. Maintain the same interval or time between taking each dose and first decrease the dose.  Cut the total daily intake of opioids by one tablet each day Next start to increase the time between doses. The last dose that should be eliminated is the evening dose.      TED hose   Complete by: As directed    Use stockings (TED hose) for 2 weeks on both leg(s).  You may remove them at night for sleeping.        Follow-up Information     Durene Romans, MD. Schedule an appointment as soon as possible for a visit in 2 week(s).   Specialty: Orthopedic Surgery Contact information: [address] [place] [address] [place] [address] [phone]                  Signed: Cassandria Anger 01/01/2023, 12:58 PM

## 2023-10-12 IMAGING — MG MM DIGITAL DIAGNOSTIC UNILAT*R* W/ TOMO W/ CAD
6 series · 6 of 18 positions shown · non-contrast
Comparison: Previous exam(s).

CLINICAL DATA: 70-year-old female recalled from screening mammogram
dated 02/11/2022 for a possible right breast asymmetry.

EXAM:
DIGITAL DIAGNOSTIC UNILATERAL RIGHT MAMMOGRAM WITH TOMOSYNTHESIS AND
CAD
TECHNIQUE: Right digital diagnostic mammography and breast tomosynthesis was
performed. The images were evaluated with computer-aided detection.

[R CC synth-2D (1 of 2)]
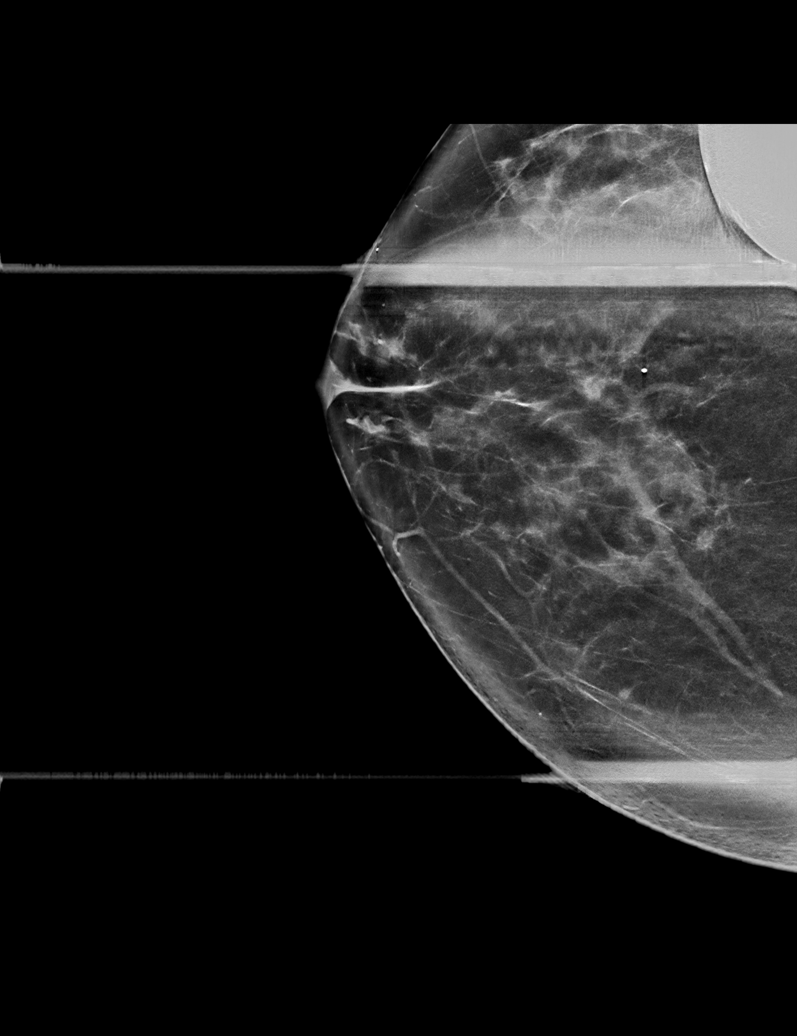

[R ML synth-2D]
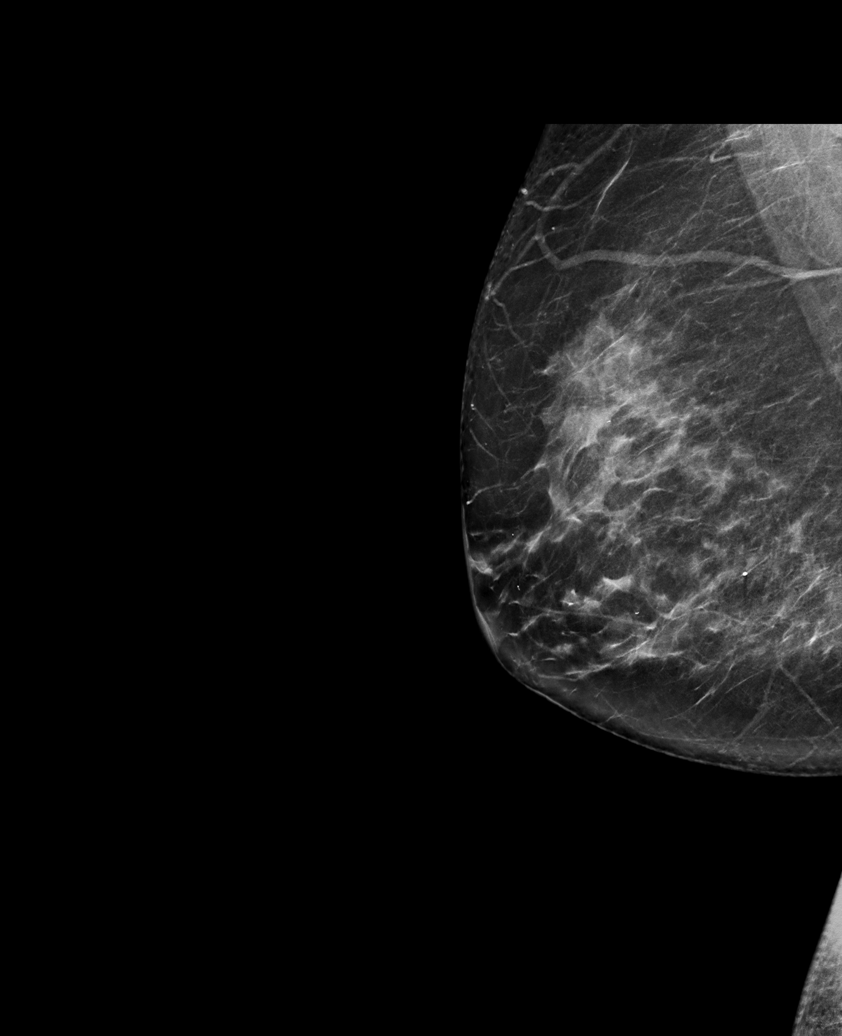

[R CC synth-2D (2 of 2)]
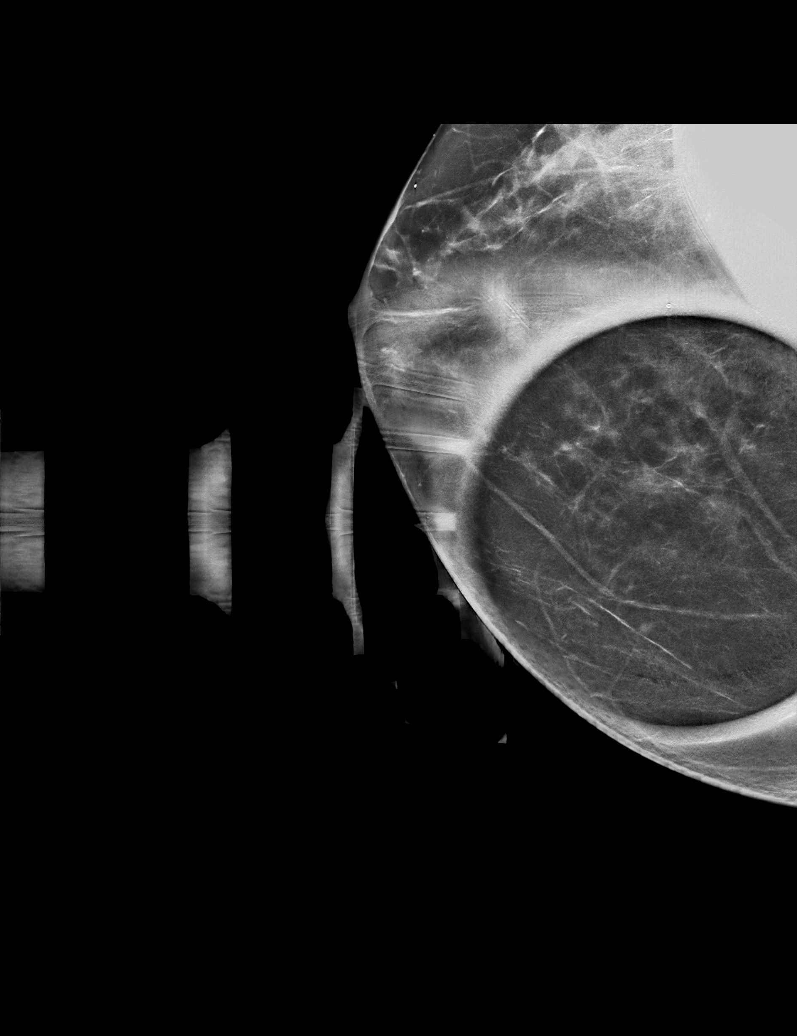

[R CC tomo (1 of 2) · tomo slice 39/76.0]
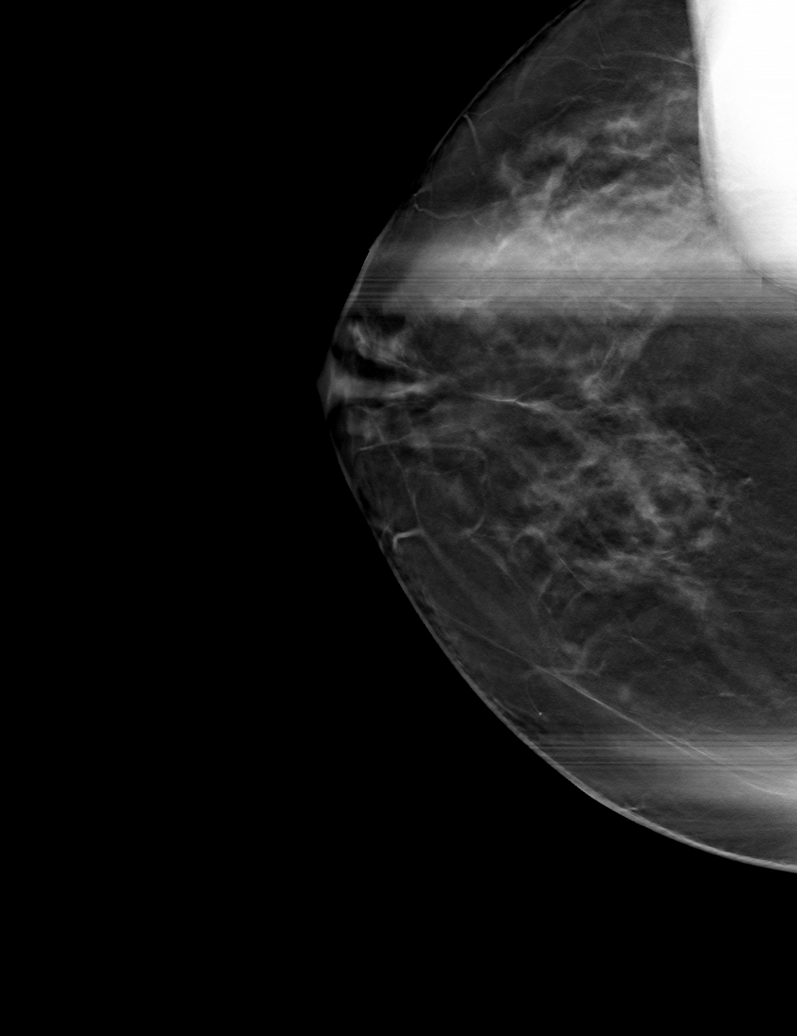

[R CC tomo (2 of 2) · tomo slice 34/67.0]
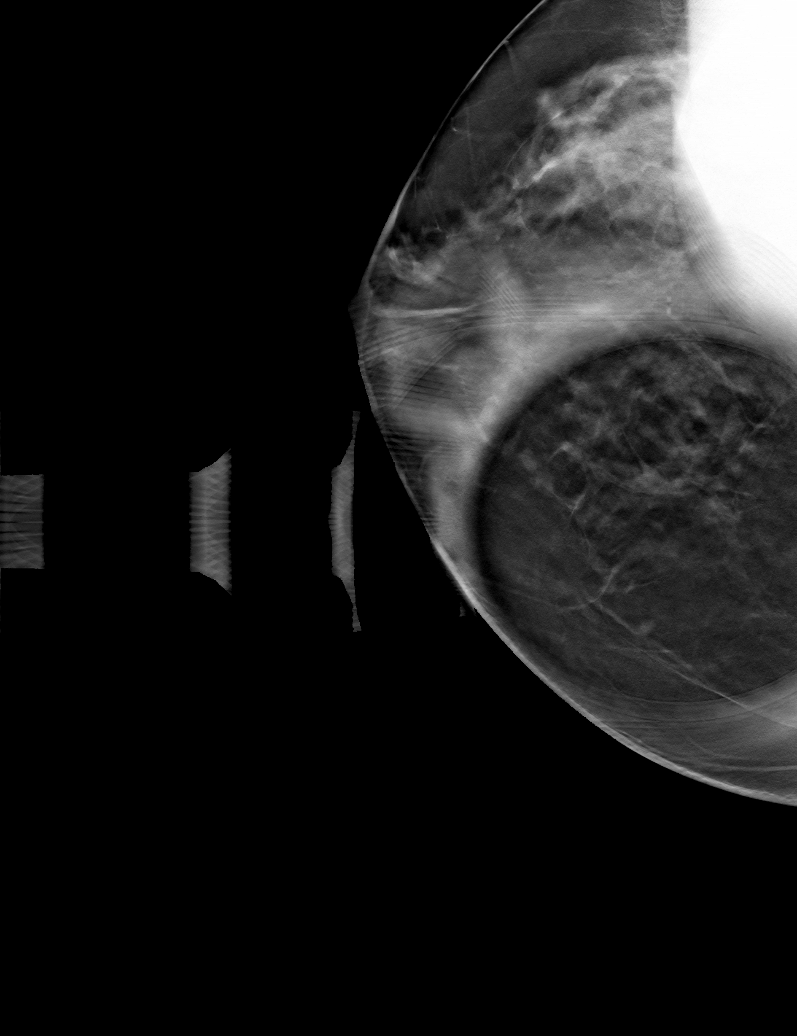

[R ML tomo · tomo slice 39/78.0]
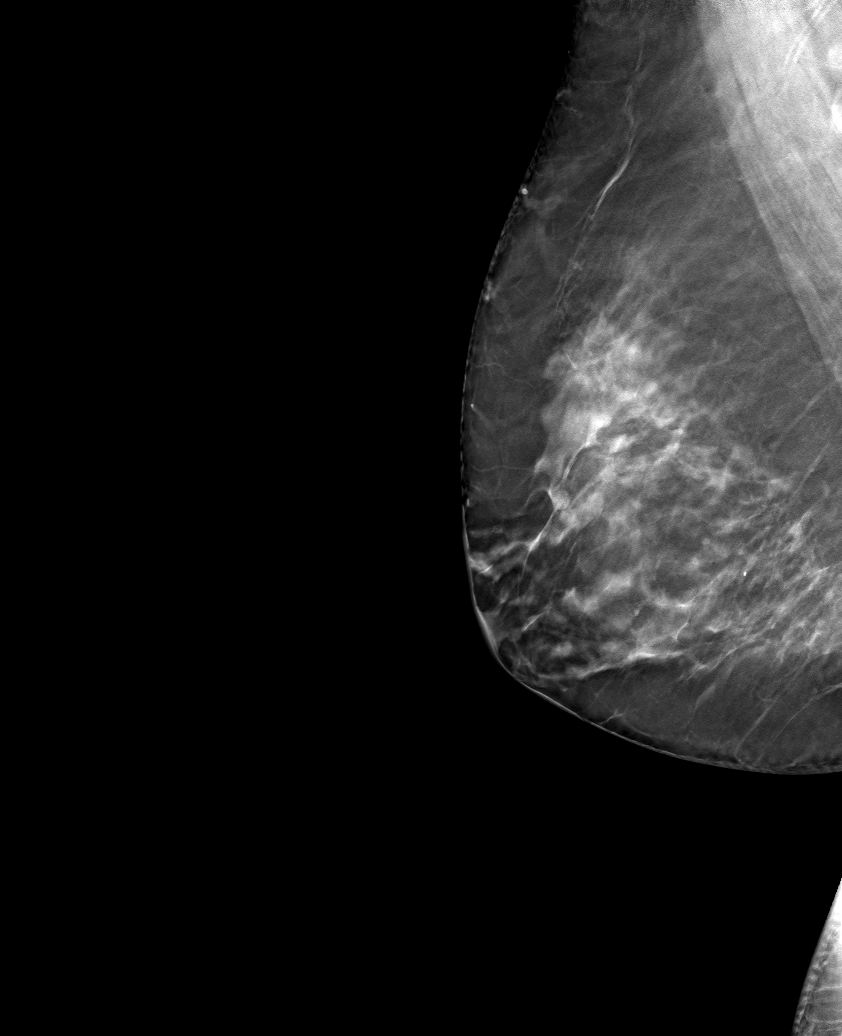

[6 of 18 positions shown; findings below may reference images not displayed]

ACR Breast Density Category c: The breast tissue is heterogeneously
dense, which may obscure small masses.
FINDINGS: Previously described, possible asymmetry in the medial right breast
seen on the cc projection resolves into well dispersed
fibroglandular tissue on additional views. No suspicious findings
identified.
IMPRESSION: No mammographic evidence of malignancy.

RECOMMENDATION:
Screening mammogram in one year.(Code:PJ-[id number])

I have discussed the findings and recommendations with the patient.
If applicable, a reminder letter will be sent to the patient
regarding the next appointment.

BI-RADS CATEGORY  1: Negative.
